# Patient Record
Sex: Female | Born: 1985 | Marital: Married
Health system: Southern US, Community
[De-identification: ages and names within clinical notes are randomized; demographics above are authoritative.]

## PROBLEM LIST (undated history)

## (undated) DIAGNOSIS — IMO0002 Reserved for concepts with insufficient information to code with codable children: Secondary | ICD-10-CM

## (undated) DIAGNOSIS — E669 Obesity, unspecified: Secondary | ICD-10-CM

## (undated) HISTORY — DX: Reserved for concepts with insufficient information to code with codable children: IMO0002

## (undated) HISTORY — DX: Obesity, unspecified: E66.9

---
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
## 2007-02-06 ENCOUNTER — Encounter: Payer: Self-pay | Admitting: Family Medicine

## 2007-05-26 ENCOUNTER — Ambulatory Visit: Payer: Self-pay | Admitting: Family Medicine

## 2007-06-05 DIAGNOSIS — E669 Obesity, unspecified: Secondary | ICD-10-CM

## 2007-06-11 ENCOUNTER — Ambulatory Visit: Payer: Self-pay | Admitting: Family Medicine

## 2007-06-20 ENCOUNTER — Encounter: Payer: Self-pay | Admitting: Family Medicine

## 2007-06-20 LAB — CONVERTED CEMR LAB
BUN: 7 mg/dL (ref 6–23)
Basophils Absolute: 0.1 10*3/uL (ref 0.0–0.1)
Basophils Relative: 1 % (ref 0–1)
CO2: 20 meq/L (ref 19–32)
Calcium: 8.5 mg/dL (ref 8.4–10.5)
Chloride: 107 meq/L (ref 96–112)
Cholesterol: 132 mg/dL (ref 0–200)
Creatinine, Ser: 0.71 mg/dL (ref 0.40–1.20)
Eosinophils Absolute: 0.2 10*3/uL (ref 0.0–0.7)
Eosinophils Relative: 2 % (ref 0–5)
Glucose, Bld: 86 mg/dL (ref 70–99)
HCT: 34.8 % — ABNORMAL LOW (ref 36.0–46.0)
HDL: 47 mg/dL (ref >39)
Hemoglobin: 10.7 g/dL — ABNORMAL LOW (ref 12.0–15.0)
LDL Cholesterol: 64 mg/dL (ref 0–99)
Lymphocytes Relative: 35 % (ref 12–46)
Lymphs Abs: 4.3 10*3/uL — ABNORMAL HIGH (ref 0.7–4.0)
MCHC: 30.7 g/dL (ref 30.0–36.0)
MCV: 67.8 fL — ABNORMAL LOW (ref 78.0–100.0)
Monocytes Absolute: 0.7 10*3/uL (ref 0.1–1.0)
Monocytes Relative: 6 % (ref 3–12)
Neutro Abs: 6.9 10*3/uL (ref 1.7–7.7)
Neutrophils Relative %: 57 % (ref 43–77)
Platelets: 429 10*3/uL — ABNORMAL HIGH (ref 150–400)
Potassium: 4 meq/L (ref 3.5–5.3)
RBC: 5.13 M/uL — ABNORMAL HIGH (ref 3.87–5.11)
RDW: 15.6 % — ABNORMAL HIGH (ref 11.5–15.5)
Sodium: 140 meq/L (ref 135–145)
Total CHOL/HDL Ratio: 2.8
Triglycerides: 106 mg/dL (ref <150)
VLDL: 21 mg/dL (ref 0–40)
WBC: 12.2 10*3/uL — ABNORMAL HIGH (ref 4.0–10.5)

## 2007-06-23 ENCOUNTER — Encounter: Payer: Self-pay | Admitting: Family Medicine

## 2007-06-23 LAB — CONVERTED CEMR LAB
Iron: 38 ug/dL — ABNORMAL LOW (ref 42–145)
Saturation Ratios: 9 % — ABNORMAL LOW (ref 20–55)
TIBC: 447 ug/dL (ref 250–470)
UIBC: 409 ug/dL

## 2007-07-17 ENCOUNTER — Ambulatory Visit: Payer: Self-pay | Admitting: Family Medicine

## 2007-08-20 ENCOUNTER — Ambulatory Visit: Payer: Self-pay | Admitting: Family Medicine

## 2007-09-01 ENCOUNTER — Telehealth: Payer: Self-pay | Admitting: Family Medicine

## 2007-12-03 ENCOUNTER — Encounter: Payer: Self-pay | Admitting: Family Medicine

## 2007-12-11 ENCOUNTER — Ambulatory Visit: Payer: Self-pay | Admitting: Family Medicine

## 2007-12-17 ENCOUNTER — Ambulatory Visit: Payer: Self-pay | Admitting: Family Medicine

## 2008-01-19 ENCOUNTER — Ambulatory Visit: Payer: Self-pay | Admitting: Family Medicine

## 2008-01-19 DIAGNOSIS — J029 Acute pharyngitis, unspecified: Secondary | ICD-10-CM

## 2008-01-19 LAB — CONVERTED CEMR LAB: Rapid Strep: NEGATIVE

## 2008-01-23 DIAGNOSIS — J309 Allergic rhinitis, unspecified: Secondary | ICD-10-CM

## 2008-04-08 ENCOUNTER — Telehealth: Payer: Self-pay | Admitting: Family Medicine

## 2008-12-14 ENCOUNTER — Encounter: Payer: Self-pay | Admitting: Family Medicine

## 2009-04-26 ENCOUNTER — Telehealth: Payer: Self-pay | Admitting: Family Medicine

## 2009-12-19 ENCOUNTER — Ambulatory Visit: Payer: Self-pay | Admitting: Family Medicine

## 2010-03-01 ENCOUNTER — Ambulatory Visit
Admission: RE | Admit: 2010-03-01 | Discharge: 2010-03-01 | Payer: Self-pay | Source: Home / Self Care | Attending: Family Medicine | Admitting: Family Medicine

## 2010-03-01 DIAGNOSIS — K5289 Other specified noninfective gastroenteritis and colitis: Secondary | ICD-10-CM | POA: Insufficient documentation

## 2010-03-07 NOTE — Assessment & Plan Note (Signed)
Summary: tb  Nurse Visit   Vital Signs:  Patient profile:   25 year old female Weight:      201.50 pounds BP sitting:   120 / 82  (left arm)  Allergies: No Known Drug Allergies  Immunizations Administered:  PPD Skin Test:    Vaccine Type: PPD    Site: right forearm    Mfr: Sanofi Pasteur    Dose: 0.1 ml    Route: ID    Given by: Adella Hare LPN    Exp. Date: 12/08/2010    Lot #: [id number]  Orders Added: 1)  TB Skin Test [86580] 2)  Admin 1st Vaccine [90471]  tb test placed with no complications  Appended Document: tb   PPD Results    Date of reading: 12/21/2009    Results: < 5mm    Interpretation: negative

## 2010-03-07 NOTE — Letter (Signed)
Summary: progress notes  progress notes   Imported By: Curtis Sites 08/24/2009 14:06:18  _____________________________________________________________________  External Attachment:    Type:   Image     Comment:   External Document

## 2010-03-07 NOTE — Letter (Signed)
Summary: phone notes  phone notes   Imported By: Curtis Sites 08/24/2009 14:02:41  _____________________________________________________________________  External Attachment:    Type:   Image     Comment:   External Document

## 2010-03-07 NOTE — Letter (Signed)
Summary: consults  consults   Imported By: Curtis Sites 08/24/2009 14:05:45  _____________________________________________________________________  External Attachment:    Type:   Image     Comment:   External Document

## 2010-03-07 NOTE — Letter (Signed)
Summary: demographic  demographic   Imported By: Curtis Sites 08/24/2009 14:08:17  _____________________________________________________________________  External Attachment:    Type:   Image     Comment:   External Document

## 2010-03-07 NOTE — Letter (Signed)
Summary: misc.  misc.   Imported By: Curtis Sites 08/24/2009 14:04:38  _____________________________________________________________________  External Attachment:    Type:   Image     Comment:   External Document

## 2010-03-07 NOTE — Progress Notes (Signed)
Summary: speak with doc  Phone Note Call from Patient   Summary of Call: omega would like to talk with dr. Lodema Hong about daughter doing some volunteer work. [phone] home [phone]  Initial call taken by: Rudene Anda,  April 26, 2009 2:17 PM  Follow-up for Phone Call        will relay message  Follow-up by: Everitt Amber LPN,  April 27, 2009 1:11 PM

## 2010-03-07 NOTE — Letter (Signed)
Summary: progress notes  progress notes   Imported By: Curtis Sites 08/24/2009 14:07:53  _____________________________________________________________________  External Attachment:    Type:   Image     Comment:   External Document

## 2010-03-23 NOTE — Assessment & Plan Note (Signed)
Summary: throwing up   Vital Signs:  Patient profile:   25 year old female Height:      66.5 inches Weight:      207.50 pounds BMI:     33.11 O2 Sat:      98 % Pulse rate:   83 / minute Pulse rhythm:   regular Resp:     16 per minute BP sitting:   122 / 90  (left arm) Cuff size:   large  Vitals Entered By: Everitt Amber LPN (March 01, 2010 4:04 PM)  Nutrition Counseling: Patient's BMI is greater than 25 and therefore counseled on weight management options. CC: Patient c/o vomiting and diarrhea since yesterday. The last time time she vomitted was an hr ago and she has diarrhea approximately every 30 minutes    CC:  Patient c/o vomiting and diarrhea since yesterday. The last time time she vomitted was an hr ago and she has diarrhea approximately every 30 minutes .  History of Present Illness: 2 day h/o vomitting and diarreah, up to 6 stools yesterday , 3 watery today, vomity x 2 today, other family members sick, no other symptoms. Reports  thatshe had been doing well prior to this. She unfortunately has not stuck wioth her diet and exercise plan and ha gained wight Denies recent fever or chills. Denies sinus pressure, nasal congestion , ear pain or sore throat. Denies chest congestion, or cough productive of sputum. Denies chest pain, palpitations, PND, orthopnea or leg swelling.  Denies change in bowel movements or bloody stool. Denies dysuria , frequency, incontinence or hesitancy. Denies  joint pain, swelling, or reduced mobility. Denies headaches, vertigo, seizures. Denies depression, anxiety or insomnia. Denies  rash, lesions, or itch.     Current Medications (verified): 1)  None  Allergies (verified): No Known Drug Allergies  Review of Systems      See HPI General:  Complains of fatigue, malaise, and weakness. Eyes:  Denies blurring and discharge. GI:  Complains of diarrhea, nausea, and vomiting. Endo:  Denies cold intolerance, excessive hunger, excessive  thirst, excessive urination, and heat intolerance. Heme:  Denies abnormal bruising and bleeding. Allergy:  Denies hives or rash and itching eyes.  Physical Exam  General:  Well-developed,well-nourished,in no acute distress; alert,appropriate and cooperative throughout examination. Ill appearing. HEENT: No facial asymmetry,  EOMI, No sinus tenderness, TM's Clear, oropharynx  pink and moist.   Chest: Clear to auscultation bilaterally.  CVS: S1, S2, No murmurs, No S3.   Abd: Soft,diffuse superficial tenderness, no guarding or rebound, hyperactive bowel sounds  MS: Adequate ROM spine, hips, shoulders and knees.  Ext: No edema.   CNS: CN 2-12 intact, power tone and sensation normal throughout.   Skin: Intact, no visible lesions or rashes.  Psych: Good eye contact, normal affect.  Memory intact, not anxious or depressed appearing.    Impression & Recommendations:  Problem # 1:  GASTROENTERITIS, ACUTE (ICD-558.9) Assessment Comment Only  Orders: Zofran 1mg . injection (G9562) Admin of Therapeutic Inj  intramuscular or subcutaneous (13086)  Discussed use of medication and role of diet. Encouraged clear liquids and electrolyte replacement fluids. Instructed to call if any signs of worsening dehydration.   Problem # 2:  OBESITY (ICD-278.00) Assessment: Deteriorated  Ht: 66.5 (03/01/2010)   Wt: 207.50 (03/01/2010)   BMI: 33.11 (03/01/2010) therapeutic lifestyle change discussed and encouraged  Complete Medication List: 1)  Lomotil 2.5-0.025 Mg Tabs (Diphenoxylate-atropine) .... One tablet up to  4 times daily as needed for diarreah 2)  Promethazine  Hcl 12.5 Mg Tabs (Promethazine hcl) .... Take 1 tablet by mouth four times a day as needed for nausea and vomitting  Patient Instructions: 1)  Please schedule a follow-up appointment in 3 months. 2)  It is important that you exercise regularly at least 20 minutes 5 times a week. If you develop chest pain, have severe difficulty breathing, or  feel very tired , stop exercising immediately and seek medical attention. 3)  You need to lose weight. Consider a lower calorie diet and regular exercise.  4)  you have acute gastroenteritis. 5)  Oral Rehydration Solution: drink 1/2 ounce every 15 minutes. If tolerated afert 1 hour, drink 1 ounce every 15 minutes. As you can tolerate, keep adding 1/2 ounce every 15 minutes, up to a total of 2-4 ounces. Contact the office if unable to tolerate oral solution, if you keep vomiting, or you continue to have signs of dehydration. 6)  You will get zofran 4mg  Im in the office. 7)  Meds are sent in  8)  Pls follow the BRAT diet. 9)  Work excuse from 01/24 to return 03/04/2010 Prescriptions: PROMETHAZINE HCL 12.5 MG TABS (PROMETHAZINE HCL) Take 1 tablet by mouth four times a day as needed for nausea and vomitting  #20 x 0   Entered and Authorized by:   Syliva Overman MD   Signed by:   Syliva Overman MD on 03/01/2010   Method used:   Printed then faxed to ...       [facility] [address] [phone]* (retail)       [address] [place], [place]  [address]       Ph: [phone]       Fax: [phone]   RxID:   [id number] LOMOTIL 2.5-0.025 MG TABS (DIPHENOXYLATE-ATROPINE) one tablet up to  4 times daily as needed for diarreah  #20 x 0   Entered and Authorized by:   Syliva Overman MD   Signed by:   Syliva Overman MD on 03/01/2010   Method used:   Printed then faxed to ...       [facility] [address] [phone]* (retail)       [address] [place]  [address]       Ph: [phone]       Fax: [phone]   RxID:   [id number]    Medication Administration  Injection # 1:    Medication: Zofran 1mg . injection    Diagnosis: GASTROENTERITIS, ACUTE (ICD-558.9)    Route: IM    Site: RUOQ gluteus    Exp Date: 04/13    Lot #: [id number]    Mfr: [email or web address]    Comments: zofran 4mg  given    Patient tolerated injection without complications    Given by: Adella Hare LPN (March 01, 2010 4:20 PM)  Orders Added: 1)  Est. Patient Level IV [15176] 2)  Zofran 1mg . injection [J2405] 3)  Admin of Therapeutic Inj  intramuscular or subcutaneous [96372]     Medication Administration  Injection # 1:    Medication: Zofran 1mg . injection    Diagnosis: GASTROENTERITIS, ACUTE (ICD-558.9)    Route: IM    Site: RUOQ gluteus    Exp Date: 04/13    Lot #: [id number]    Mfr: [email or web address]    Comments: zofran 4mg  given    Patient tolerated injection without complications    Given by: Adella Hare LPN (March 01, 2010 4:20 PM)  Orders Added: 1)  Est. Patient Level IV [54098] 2)  Zofran 1mg . injection [J2405] 3)  Admin of Therapeutic Inj  intramuscular or subcutaneous [11914]

## 2010-05-25 ENCOUNTER — Encounter: Payer: Self-pay | Admitting: Family Medicine

## 2010-05-29 ENCOUNTER — Ambulatory Visit: Payer: Self-pay | Admitting: Family Medicine

## 2011-01-10 ENCOUNTER — Ambulatory Visit (INDEPENDENT_AMBULATORY_CARE_PROVIDER_SITE_OTHER): Payer: Self-pay

## 2011-01-10 DIAGNOSIS — Z23 Encounter for immunization: Secondary | ICD-10-CM

## 2011-02-08 ENCOUNTER — Ambulatory Visit (INDEPENDENT_AMBULATORY_CARE_PROVIDER_SITE_OTHER): Payer: Managed Care, Other (non HMO) | Admitting: Family Medicine

## 2011-02-08 ENCOUNTER — Encounter: Payer: Self-pay | Admitting: Family Medicine

## 2011-02-08 ENCOUNTER — Other Ambulatory Visit: Payer: Self-pay | Admitting: Family Medicine

## 2011-02-08 VITALS — BP 112/74 | HR 94 | Resp 16 | Ht 66.0 in | Wt 251.8 lb

## 2011-02-08 DIAGNOSIS — Z309 Encounter for contraceptive management, unspecified: Secondary | ICD-10-CM

## 2011-02-08 DIAGNOSIS — E559 Vitamin D deficiency, unspecified: Secondary | ICD-10-CM

## 2011-02-08 DIAGNOSIS — Z0189 Encounter for other specified special examinations: Secondary | ICD-10-CM

## 2011-02-08 DIAGNOSIS — Z23 Encounter for immunization: Secondary | ICD-10-CM

## 2011-02-08 NOTE — Patient Instructions (Signed)
F/u in 4 months.  It is important that you exercise regularly at least 45 minutes 5 times a week. If you develop chest pain, have severe difficulty breathing, or feel very tired, stop exercising immediately and seek medical attention   A healthy diet is rich in fruit, vegetables and whole grains. Poultry fish, nuts and beans are a healthy choice for protein rather then red meat. A low sodium diet and drinking 64 ounces of water daily is generally recommended. Oils and sweet should be limited. Carbohydrates especially for those who are diabetic or overweight, should be limited to 34-45 gram per meal. It is important to eat on a regular schedule, at least 3 times daily. Snacks should be primarily fruits, vegetables or nuts.    Fasting labs asap  TdaP today.  You will receive a 1500 cal diet sheet  Continue your current contraception, as we discussed

## 2011-02-09 LAB — HEMOGLOBIN A1C: Mean Plasma Glucose: 114 mg/dL (ref <117)

## 2011-02-09 LAB — DIFFERENTIAL
Basophils Absolute: 0.1 10*3/uL (ref 0.0–0.1)
Basophils Relative: 1 % (ref 0–1)
Eosinophils Absolute: 0.2 10*3/uL (ref 0.0–0.7)
Neutrophils Relative %: 68 % (ref 43–77)

## 2011-02-09 LAB — COMPREHENSIVE METABOLIC PANEL
Albumin: 4.1 g/dL (ref 3.5–5.2)
Alkaline Phosphatase: 106 U/L (ref 39–117)
BUN: 11 mg/dL (ref 6–23)
Calcium: 9.5 mg/dL (ref 8.4–10.5)
Chloride: 104 mEq/L (ref 96–112)
Glucose, Bld: 89 mg/dL (ref 70–99)
Potassium: 4.4 mEq/L (ref 3.5–5.3)

## 2011-02-09 LAB — CBC
HCT: 36.5 % (ref 36.0–46.0)
Hemoglobin: 11.2 g/dL — ABNORMAL LOW (ref 12.0–15.0)
MCH: 20.7 pg — ABNORMAL LOW (ref 26.0–34.0)
MCHC: 30.7 g/dL (ref 30.0–36.0)
MCV: 67.3 fL — ABNORMAL LOW (ref 78.0–100.0)
Platelets: 490 10*3/uL — ABNORMAL HIGH (ref 150–400)
RBC: 5.42 MIL/uL — ABNORMAL HIGH (ref 3.87–5.11)
RDW: 14.9 % (ref 11.5–15.5)
WBC: 11.6 10*3/uL — ABNORMAL HIGH (ref 4.0–10.5)

## 2011-02-09 LAB — LIPID PANEL
Cholesterol: 134 mg/dL (ref 0–200)
HDL: 47 mg/dL (ref >39)
LDL Cholesterol: 73 mg/dL (ref 0–99)
Total CHOL/HDL Ratio: 2.9 Ratio
Triglycerides: 68 mg/dL (ref <150)
VLDL: 14 mg/dL (ref 0–40)

## 2011-02-09 LAB — VITAMIN D 25 HYDROXY (VIT D DEFICIENCY, FRACTURES): Vit D, 25-Hydroxy: 22 ng/mL — ABNORMAL LOW (ref 30–89)

## 2011-02-09 LAB — TSH: TSH: 2.616 u[IU]/mL (ref 0.350–4.500)

## 2011-02-11 DIAGNOSIS — E559 Vitamin D deficiency, unspecified: Secondary | ICD-10-CM | POA: Insufficient documentation

## 2011-02-11 DIAGNOSIS — Z309 Encounter for contraceptive management, unspecified: Secondary | ICD-10-CM | POA: Insufficient documentation

## 2011-02-11 NOTE — Assessment & Plan Note (Signed)
counselled re lifestyle change and commitment to this for health needs. Goal is 4 pounds per month of weight loss.No dietary supplements at this time. Will screen for prediabetes/diabetes and lipids as well as thyroid disease

## 2011-02-11 NOTE — Assessment & Plan Note (Signed)
Pt to take OTC vit D once daily 800Iu of D3, she will be called with the info

## 2011-02-11 NOTE — Assessment & Plan Note (Signed)
Discussed pros and cons of contraceptive options. Since obesity is a concern, advised sytaying on OCP at this time

## 2011-02-11 NOTE — Progress Notes (Signed)
Subjective:     Patient ID: Laurie Chen, female   DOB: 1985/11/30, 26 y.o.   MRN: [id number]  HPI Pt is here to re establish care, and she has concerns about contraception as well as obesity. She has not been involved in any serious lifestyle change for improved health and weight loss, but started an exercise program one week ago. States she is motivated to changing her det at this time also. Has questions about progesterone only , long acting hormonal contraception , no mention of "forgetting the pill"I advised this was more associated with weight gain, and encouraged her to continue with what she was taking. Had pap 1 month ago reportedly, and has completed  The HPV series   Review of Systems See HPI Denies recent fever or chills. Denies sinus pressure, nasal congestion, ear pain or sore throat. Denies chest congestion, productive cough or wheezing. Denies chest pains, palpitations and leg swelling Denies abdominal pain, nausea, vomiting,diarrhea or constipation.   Denies dysuria, frequency, hesitancy or incontinence. Denies joint pain, swelling and limitation in mobility. Denies headaches, seizures, numbness, or tingling. Denies depression, anxiety or insomnia. Denies skin break down or rash.        Objective:   Physical Exam Patient alert and oriented and in no cardiopulmonary distress.  HEENT: No facial asymmetry, EOMI, no sinus tenderness,  oropharynx pink and moist.  Neck supple no adenopathy.  Chest: Clear to auscultation bilaterally.  CVS: S1, S2 no murmurs, no S3.  ABD: Soft non tender. Bowel sounds normal.  Ext: No edema  MS: Adequate ROM spine, shoulders, hips and knees.  Skin: Intact, no ulcerations or rash noted.  Psych: Good eye contact, normal affect. Memory intact not anxious or depressed appearing.  CNS: CN 2-12 intact, power, tone and sensation normal throughout.     Assessment:         Plan:

## 2011-02-20 ENCOUNTER — Encounter: Payer: Self-pay | Admitting: Family Medicine

## 2011-02-21 ENCOUNTER — Encounter: Payer: Self-pay | Admitting: Family Medicine

## 2011-02-21 ENCOUNTER — Ambulatory Visit (INDEPENDENT_AMBULATORY_CARE_PROVIDER_SITE_OTHER): Payer: Managed Care, Other (non HMO) | Admitting: Family Medicine

## 2011-02-21 VITALS — BP 118/74 | HR 100 | Temp 98.9°F | Resp 18 | Ht 66.0 in | Wt 246.1 lb

## 2011-02-21 DIAGNOSIS — E669 Obesity, unspecified: Secondary | ICD-10-CM

## 2011-02-21 DIAGNOSIS — J029 Acute pharyngitis, unspecified: Secondary | ICD-10-CM

## 2011-02-21 DIAGNOSIS — E559 Vitamin D deficiency, unspecified: Secondary | ICD-10-CM

## 2011-02-21 MED ORDER — FLUCONAZOLE 150 MG PO TABS: ORAL_TABLET | ORAL | Status: AC

## 2011-02-21 MED ORDER — PENICILLIN V POTASSIUM 500 MG PO TABS: 500.0000 mg | ORAL_TABLET | Freq: Three times a day (TID) | ORAL | Status: AC

## 2011-02-21 NOTE — Assessment & Plan Note (Signed)
Improved. Pt applauded on succesful weight loss through lifestyle change, and encouraged to continue same. Weight loss goal set for the next several months.  

## 2011-02-21 NOTE — Progress Notes (Signed)
  Subjective:    Patient ID: Laurie Chen, female    DOB: 1986-01-09, 26 y.o.   MRN: [id number]  HPI 2 day h/o sore throat , chills , loss of voice and tender neck glands, Did not work yesterday due to symptoms, still no better. She denies chest congestion or productive cough. Pt reports prior to this she haas been well, she has been working on lifestyle change for weight loss with success since her last visit    Review of Systems See HPI  Denies chest pains, palpitations and leg swelling Denies abdominal pain, nausea, vomiting,diarrhea or constipation.   Denies dysuria, frequency, hesitancy or incontinence. Denies headaches, seizures, numbness, or tingling.       Objective:   Physical Exam Patient alert and oriented and in no cardiopulmonary distress.  HEENT: No facial asymmetry, EOMI, no sinus tenderness,  oropharynx erythematous and moist.  Neck supple bilateral anterior adenitis  Chest: Clear to auscultation bilaterally.  CVS: S1, S2 no murmurs, no S3.  ABD: Soft non tender. Bowel sounds normal.  Ext: No edema  MS: adequate ROM spine, shoulders, hips and knees      Assessment & Plan:

## 2011-02-21 NOTE — Patient Instructions (Signed)
F/u as before.  You are being treated for acute pharyngitis.  Please take all medication as prescribed.  Congrats on weight loss  You will receive a work excuse.

## 2011-02-22 ENCOUNTER — Telehealth: Payer: Self-pay | Admitting: Family Medicine

## 2011-02-22 NOTE — Telephone Encounter (Signed)
Ok per Dr Lodema Hong, already collected

## 2011-02-22 NOTE — Telephone Encounter (Signed)
Can we extend?

## 2011-02-24 NOTE — Assessment & Plan Note (Signed)
Pt to use daily OTC supplements

## 2011-02-24 NOTE — Assessment & Plan Note (Signed)
Acute symptoms with voice loss, antibiotics prescribed and work excuse

## 2011-02-24 NOTE — Assessment & Plan Note (Signed)
Improved. Pt applauded on succesful weight loss through lifestyle change, and encouraged to continue same. Weight loss goal set for the next several months.  

## 2011-05-09 ENCOUNTER — Encounter: Payer: Self-pay | Admitting: Family Medicine

## 2011-05-09 ENCOUNTER — Ambulatory Visit (INDEPENDENT_AMBULATORY_CARE_PROVIDER_SITE_OTHER): Payer: Managed Care, Other (non HMO) | Admitting: Family Medicine

## 2011-05-09 VITALS — BP 120/86 | HR 82 | Temp 99.3°F | Resp 16 | Ht 66.0 in | Wt 249.1 lb

## 2011-05-09 DIAGNOSIS — J029 Acute pharyngitis, unspecified: Secondary | ICD-10-CM

## 2011-05-09 DIAGNOSIS — E669 Obesity, unspecified: Secondary | ICD-10-CM

## 2011-05-09 DIAGNOSIS — J019 Acute sinusitis, unspecified: Secondary | ICD-10-CM

## 2011-05-09 LAB — POCT RAPID STREP A (OFFICE): Rapid Strep A Screen: NEGATIVE

## 2011-05-09 MED ORDER — FLUCONAZOLE 150 MG PO TABS: ORAL_TABLET | ORAL | Status: AC

## 2011-05-09 MED ORDER — PENICILLIN V POTASSIUM 500 MG PO TABS: 500.0000 mg | ORAL_TABLET | Freq: Three times a day (TID) | ORAL | Status: AC

## 2011-05-09 NOTE — Assessment & Plan Note (Signed)
Unchanged. Patient re-educated about  the importance of commitment to a  minimum of 150 minutes of exercise per week. The importance of healthy food choices with portion control discussed. Encouraged to start a food diary, count calories and to consider  joining a support group. Sample diet sheets offered. Goals set by the patient for the next several months.    

## 2011-05-09 NOTE — Progress Notes (Signed)
  Subjective:    Patient ID: Laurie Chen, female    DOB: 1985/05/12, 26 y.o.   MRN: [id number]  HPI r4 day h/o head congestion and pressure with yellow green nasal drainage, has also had sore throat, denies fever , has had  Chills, no fever.c/o sore throat. Has worked on weight loss, with no success, which seems surprising to her  Review of Systems See HPI Denies recent fever or chills.  Denies chest congestion, productive cougDh or wheezing. Denies chest pains, palpitations and leg swelling Denies abdominal pain, nausea, vomiting,diarrhea or constipation.   Denies dysuria, frequency, hesitancy or incontinence. Denies joint pain, swelling and limitation in mobility. Denies headaches, seizures, numbness, or tingling. Denies depression, anxiety or insomnia. Denies skin break down or rash.        Objective:   Physical Exam Patient alert and oriented and in no cardiopulmonary distress.  HEENT: No facial asymmetry, EOMI,maxillary sinus tenderness,  oropharynx erythematous and moist.  Neck supple no adenopathy.  Chest: Clear to auscultation bilaterally.  CVS: S1, S2 no murmurs, no S3.  ABD: Soft non tender. Bowel sounds normal.  Ext: No edema  MS: Adequate ROM spine, shoulders, hips and knees.  Skin: Intact, no ulcerations or rash noted.  Psych: Good eye contact, normal affect. Memory intact not anxious or depressed appearing.  CNS: CN 2-12 intact, power, tone and sensation normal throughout.        Assessment & Plan:

## 2011-05-09 NOTE — Patient Instructions (Signed)
F/u in 6 month or sooner if needed  You are being treated for pharyngitis and sinusitis.  Work excuse for today only  It is important that you exercise regularly at least 30 minutes 5 times a week. If you develop chest pain, have severe difficulty breathing, or feel very tired, stop exercising immediately and seek medical attention   A healthy diet is rich in fruit, vegetables and whole grains. Poultry fish, nuts and beans are a healthy choice for protein rather then red meat. A low sodium diet and drinking 64 ounces of water daily is generally recommended. Oils and sweet should be limited. Carbohydrates especially for those who are diabetic or overweight, should be limited to 34-45 gram per meal. It is important to eat on a regular schedule, at least 3 times daily. Snacks should be primarily fruits, vegetables or nuts.   Remember the birth control pills are less effective while you are on penicillin, use condoms also for the next 2 months

## 2011-05-13 DIAGNOSIS — J019 Acute sinusitis, unspecified: Secondary | ICD-10-CM | POA: Insufficient documentation

## 2011-05-13 NOTE — Assessment & Plan Note (Signed)
Unchanged. Patient re-educated about  the importance of commitment to a  minimum of 150 minutes of exercise per week. The importance of healthy food choices with portion control discussed. Encouraged to start a food diary, count calories and to consider  joining a support group. Sample diet sheets offered. Goals set by the patient for the next several months.    

## 2011-05-13 NOTE — Assessment & Plan Note (Signed)
Acute symptoms, rapid strep negative

## 2011-05-13 NOTE — Assessment & Plan Note (Signed)
Antibiotic course prescribed 

## 2011-06-13 ENCOUNTER — Ambulatory Visit: Payer: Self-pay | Admitting: Family Medicine

## 2011-08-23 ENCOUNTER — Ambulatory Visit: Payer: Managed Care, Other (non HMO) | Admitting: Family Medicine

## 2011-08-23 ENCOUNTER — Telehealth: Payer: Self-pay | Admitting: Family Medicine

## 2011-08-23 NOTE — Telephone Encounter (Signed)
Coming in today

## 2011-08-23 NOTE — Telephone Encounter (Signed)
Has not been treated here for this in the past needs ov, pls work in with any provider who has openening

## 2011-08-23 NOTE — Telephone Encounter (Signed)
appt cancelled reportedly

## 2011-12-13 ENCOUNTER — Telehealth: Payer: Self-pay | Admitting: Family Medicine

## 2011-12-13 NOTE — Telephone Encounter (Signed)
Called patient and left message for them to return call at the office   

## 2011-12-13 NOTE — Telephone Encounter (Signed)
Can a diflucan be sent in?

## 2011-12-13 NOTE — Telephone Encounter (Signed)
What medication did she get , and what are her current symptoms and for how long, pls document

## 2011-12-14 NOTE — Telephone Encounter (Signed)
Called patient and left message for them to return call at the office   

## 2012-04-03 ENCOUNTER — Encounter: Payer: Self-pay | Admitting: Family Medicine

## 2012-04-03 ENCOUNTER — Ambulatory Visit (INDEPENDENT_AMBULATORY_CARE_PROVIDER_SITE_OTHER): Payer: Managed Care, Other (non HMO) | Admitting: Family Medicine

## 2012-04-03 DIAGNOSIS — K5900 Constipation, unspecified: Secondary | ICD-10-CM

## 2012-04-03 MED ORDER — PHENTERMINE HCL 37.5 MG PO TABS: 37.5000 mg | ORAL_TABLET | Freq: Every day | ORAL | Status: DC

## 2012-04-03 NOTE — Progress Notes (Signed)
  Subjective:    Patient ID: Laurie Chen, female    DOB: 1985/10/20, 27 y.o.   MRN: [id number]  HPI Patient here secondary to obesity she's currently at her heaviest adult weight. At age of 30 she was 180 pounds she started gaining weight when she went to college. She's tried multiple diets including cutting out all carbs and eating only take foods and grilled foods. She also exercises 3045 minutes 3 times a week. 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 Her 24-hour recall no breakfast, lunch salad with grilled chicken and low-fat dressing, dinner big chicken with green beans she does admit to eating a lot of chips injury juice if she does not like water she often will have constipation secondary to this.  She wants to go on a diet pill, has not used any OTC supplements, she has gained 18lbs since last visit with PCP in April    Review of Systems  GEN- denies fatigue, fever, weight loss,weakness, recent illness HEENT- denies eye drainage, change in vision, nasal discharge, CVS- denies chest pain, palpitations RESP- denies SOB, cough, wheeze ABD- denies N/V, change in stools, abd pain GU- denies dysuria, hematuria, dribbling, incontinence MSK- denies joint pain, muscle aches, injury Neuro- denies headache, dizziness, syncope, seizure activity      Objective:   Physical Exam GEN- NAD, alert and oriented x3, obese HEENT- PERRL, EOMI, non injected sclera, pink conjunctiva, MMM, oropharynx clear Neck- Supple, no thyromegaly CVS- RRR, no murmur RESP-CTAB ABD-NABS,soft,NT,ND EXT- No edema Pulses- Radial, DP- 2+ Psych-normal affect and mood       Assessment & Plan:

## 2012-04-03 NOTE — Patient Instructions (Signed)
Start the phentermine take 1/2 tablet a day  You can increase to 1 tablet daily after 2 weeks Goal- 8lbs off  Read the handout  F/U 8 weeks

## 2012-04-04 DIAGNOSIS — K5900 Constipation, unspecified: Secondary | ICD-10-CM | POA: Insufficient documentation

## 2012-04-04 NOTE — Assessment & Plan Note (Signed)
I think this is dietary related. I discussed importance of water and fiber in her diet. She can also tried MiraLAX over-the-counter if needed

## 2012-04-04 NOTE — Assessment & Plan Note (Signed)
Morbidly obese. She is 6 area high-risk for coronary artery disease diabetes mellitus hypertension. Because of her family members are also obese. We spent approximately 20 minutes on the visit with greater than 50% spent on counseling on proper diet and activity. She was given handout on foods I will go ahead and start her on phentermine discussed the risks and benefits of taking the medication as well as side effects

## 2012-05-29 ENCOUNTER — Ambulatory Visit: Payer: Managed Care, Other (non HMO) | Admitting: Family Medicine

## 2012-06-02 ENCOUNTER — Encounter: Payer: Self-pay | Admitting: Family Medicine

## 2012-06-02 ENCOUNTER — Ambulatory Visit (INDEPENDENT_AMBULATORY_CARE_PROVIDER_SITE_OTHER): Payer: Managed Care, Other (non HMO) | Admitting: Family Medicine

## 2012-06-02 VITALS — BP 120/70 | HR 74 | Resp 16 | Ht 66.0 in | Wt 254.1 lb

## 2012-06-02 DIAGNOSIS — D649 Anemia, unspecified: Secondary | ICD-10-CM

## 2012-06-02 DIAGNOSIS — K5900 Constipation, unspecified: Secondary | ICD-10-CM

## 2012-06-02 MED ORDER — PHENTERMINE HCL 37.5 MG PO TABS: 37.5000 mg | ORAL_TABLET | Freq: Every day | ORAL | Status: DC

## 2012-06-02 NOTE — Assessment & Plan Note (Signed)
Due to iron def in past, recheck for this year, last 11.2

## 2012-06-02 NOTE — Assessment & Plan Note (Signed)
Discussed fiber, water again. Miralax, she did not see this notation on last visit, 1 cap full daily

## 2012-06-02 NOTE — Assessment & Plan Note (Signed)
Improved with phentermine, 13lb weight loss 8 weeks, continue phentermine for another 8 weeks, increase aerobic exercise/cardio RTC with PCP for recheck FLP normal  1 year ago

## 2012-06-02 NOTE — Patient Instructions (Signed)
Continue the phenterimine Increase your aerobic exercise to 30- 60 minutes of cardio 4 days a week Miralax - Purple and pink bottle F/U Dr, Lodema Hong in 2 months

## 2012-06-02 NOTE — Progress Notes (Signed)
  Subjective:    Patient ID: Laurie Chen, female    DOB: [birth year], 27 y.o.   MRN: [id number]  HPI  Patient here to followup weight loss. She was started on phentermine 8 weeks ago she has lost 13 pounds. She is increasing her activity was doing more around the house. She's also change her diet 24-hour recall she has Cheerios for breakfast with fruit lunch she has salad with grilled chicken she has raisins or fruit for snacking she eats salmon cakes or chicken and veggies for dinner she'll also have another snack with typically apples before bedtime. Her beverages consists of water and green tea Continues to have hard stools and straining with stools, improved some with increase water, BM every other day  Review of Systems  GEN- denies fatigue, fever, weight loss,weakness, recent illness HEENT- denies eye drainage, change in vision, nasal discharge, CVS- denies chest pain, palpitations RESP- denies SOB, cough, wheeze Neuro- denies headache, dizziness, syncope, seizure activity      Objective:   Physical Exam  GEN- NAD, alert and oriented x3 CVS- RRR, no murmur RESP-CTAB EXT- No edema Pulses- Radial 2+        Assessment & Plan:

## 2012-06-05 ENCOUNTER — Ambulatory Visit: Payer: Managed Care, Other (non HMO) | Admitting: Family Medicine

## 2012-08-28 ENCOUNTER — Encounter: Payer: Self-pay | Admitting: Family Medicine

## 2012-08-28 ENCOUNTER — Ambulatory Visit: Payer: Managed Care, Other (non HMO) | Admitting: Family Medicine

## 2013-03-23 ENCOUNTER — Encounter (INDEPENDENT_AMBULATORY_CARE_PROVIDER_SITE_OTHER): Payer: Self-pay

## 2013-03-23 ENCOUNTER — Ambulatory Visit (INDEPENDENT_AMBULATORY_CARE_PROVIDER_SITE_OTHER): Payer: Managed Care, Other (non HMO) | Admitting: Family Medicine

## 2013-03-23 ENCOUNTER — Encounter: Payer: Self-pay | Admitting: Family Medicine

## 2013-03-23 VITALS — BP 106/70 | HR 70 | Resp 18 | Ht 66.0 in | Wt 254.1 lb

## 2013-03-23 DIAGNOSIS — L708 Other acne: Secondary | ICD-10-CM

## 2013-03-23 DIAGNOSIS — L700 Acne vulgaris: Secondary | ICD-10-CM | POA: Insufficient documentation

## 2013-03-23 DIAGNOSIS — E559 Vitamin D deficiency, unspecified: Secondary | ICD-10-CM

## 2013-03-23 DIAGNOSIS — R946 Abnormal results of thyroid function studies: Secondary | ICD-10-CM

## 2013-03-23 DIAGNOSIS — L259 Unspecified contact dermatitis, unspecified cause: Secondary | ICD-10-CM

## 2013-03-23 DIAGNOSIS — D649 Anemia, unspecified: Secondary | ICD-10-CM

## 2013-03-23 DIAGNOSIS — L409 Psoriasis, unspecified: Secondary | ICD-10-CM

## 2013-03-23 DIAGNOSIS — L408 Other psoriasis: Secondary | ICD-10-CM

## 2013-03-23 DIAGNOSIS — J309 Allergic rhinitis, unspecified: Secondary | ICD-10-CM

## 2013-03-23 DIAGNOSIS — Z13220 Encounter for screening for lipoid disorders: Secondary | ICD-10-CM

## 2013-03-23 DIAGNOSIS — L309 Dermatitis, unspecified: Secondary | ICD-10-CM | POA: Insufficient documentation

## 2013-03-23 NOTE — Assessment & Plan Note (Signed)
Uses otc meds as needed

## 2013-03-23 NOTE — Patient Instructions (Signed)
F/U in 3.5 months, call if you need me before  You are referred for an KoreauS of your thyroid gland followed by an appointment with Dr Fransico HimNida  It is important that you exercise regularly at least 30 minutes 7 times a week. If you develop chest pain, have severe difficulty breathing, or feel very tired, stop exercising immediately and seek medical attention  1200 to 1500 calories per day  Weight loss goal of 2.5 to 3 pounds per month    A healthy diet is rich in fruit, vegetables and whole grains. Poultry fish, nuts and beans are a healthy choice for protein rather then red meat. A low sodium diet and drinking 64 ounces of water daily is generally recommended. Oils and sweet should be limited. Carbohydrates especially for those who are diabetic or overweight, should be limited to 45 to 60 gram per meal. It is important to eat on a regular schedule, at least 3 times daily. Snacks should be primarily fruits, vegetables or nuts.  Adequate rest, generally 6 to 8 hours per night is important for good health.Good sleep hygiene involves setting a regular bedtime, and turning off all sound and light in your sleep environment.Limiting caffeine intake will also help with the ability to rest well  Lab work needs to be done 3 to 5 days this week if possible.CBC, fasting lipid, chem 7, HBA1C, Vit D  All medications need to be brought to every visit

## 2013-03-23 NOTE — Assessment & Plan Note (Signed)
Updated lab needed as soon as possible.  

## 2013-03-23 NOTE — Progress Notes (Signed)
   Subjective:    Patient ID: Laurie Chen, female    DOB: 01-30-1986, 28 y.o.   MRN: [id number]  HPI  Pt sent by dermatologist due to recent abnormality in thyroid labs. She notes hair loss in the past month, this is the only symptom she notes. Diagnosed with psoriasis last Fall, and on med, for this.   has light /no periods with implanon Had excessive approx 50 pound weight gain between 2012 to 2013, unclear reason and has been unable to lose weight since then.   Review of Systems See HPI Denies recent fever or chills. Denies sinus pressure, nasal congestion, ear pain or sore throat. Denies chest congestion, productive cough or wheezing. Denies chest pains, palpitations and leg swelling Denies abdominal pain, nausea, vomiting,diarrhea or constipation.   Denies dysuria, frequency, hesitancy or incontinence. Denies joint pain, swelling and limitation in mobility. Denies headaches, seizures, numbness, or tingling. Denies depression, anxiety or insomnia.        Objective:   Physical Exam  BP 106/70  Pulse 70  Resp 18  Ht 5\' 6"  (1.676 m)  Wt 254 lb 1.9 oz (115.268 kg)  BMI 41.04 kg/m2  SpO2 99% Patient alert and oriented and in no cardiopulmonary distress.  HEENT: No facial asymmetry, EOMI, no sinus tenderness,  oropharynx pink and moist.  Neck supple no adenopathy.  Chest: Clear to auscultation bilaterally.  CVS: S1, S2 no murmurs, no S3.  ABD: Soft non tender. Bowel sounds normal.  Ext: No edema  MS: Adequate ROM spine, shoulders, hips and knees.  Skin: Intact, mild facial acne, hyperpigmented macular lesion on left cheek, her birthmark. No scaling seen in exposed areas from psoriasis  Psych: Good eye contact, normal affect. Memory intact not anxious or depressed appearing.  CNS: CN 2-12 intact, power, tone and sensation normal throughout.       Assessment & Plan:  Thyroid function test abnormal Recent abnormal thyroid function tests, inability to lose  weight with 50 pound weight loss in less than 1 year, several years ago and goiter, refer for US and endo eval  Morbid obesity Unchanged, no real benefit with phentermine in the past, will seek full endo eval before re approaching use of appetite suppressant Patient re-educated about  the importance of commitment to a  minimum of 150 minutes of exercise per week. The importance of healthy food choices with portion control discussed. Encouraged to start a food diary, count calories and to consider  joining a support group. Sample diet sheets offered. Goals set by the patient for the next several months.     Vitamin D deficiency Updated lab needed as soon as possible.   Psoriasis Managed by derm, and topical steroid used  Acne vulgaris Daily minocycline  prescribed  ALLERGIC RHINITIS CAUSE UNSPECIFIED Uses otc meds as needed  Anemia Updated lab needed as soon as possible

## 2013-03-23 NOTE — Assessment & Plan Note (Signed)
Recent abnormal thyroid function tests, inability to lose weight with 50 pound weight loss in less than 1 year, several years ago and goiter, refer for US and endo eval

## 2013-03-23 NOTE — Assessment & Plan Note (Signed)
Managed by derm, and topical steroid used

## 2013-03-23 NOTE — Assessment & Plan Note (Signed)
Daily minocycline  prescribed

## 2013-03-23 NOTE — Assessment & Plan Note (Signed)
Updated lab needed as soon as possible.

## 2013-03-23 NOTE — Assessment & Plan Note (Signed)
Unchanged, no real benefit with phentermine in the past, will seek full endo eval before re approaching use of appetite suppressant Patient re-educated about  the importance of commitment to a  minimum of 150 minutes of exercise per week. The importance of healthy food choices with portion control discussed. Encouraged to start a food diary, count calories and to consider  joining a support group. Sample diet sheets offered. Goals set by the patient for the next several months.

## 2013-03-26 ENCOUNTER — Ambulatory Visit (HOSPITAL_COMMUNITY): Payer: Managed Care, Other (non HMO)

## 2013-03-27 ENCOUNTER — Other Ambulatory Visit: Payer: Self-pay | Admitting: Family Medicine

## 2013-03-27 ENCOUNTER — Ambulatory Visit (HOSPITAL_COMMUNITY)
Admission: RE | Admit: 2013-03-27 | Discharge: 2013-03-27 | Disposition: A | Discharge status: Home / Self Care | Payer: Managed Care, Other (non HMO) | Source: Ambulatory Visit | Attending: Family Medicine | Admitting: Family Medicine

## 2013-03-27 DIAGNOSIS — R946 Abnormal results of thyroid function studies: Secondary | ICD-10-CM | POA: Insufficient documentation

## 2013-03-27 LAB — CBC
HEMATOCRIT: 34.9 % — AB (ref 36.0–46.0)
Hemoglobin: 11 g/dL — ABNORMAL LOW (ref 12.0–15.0)
MCH: 21 pg — ABNORMAL LOW (ref 26.0–34.0)
MCHC: 31.5 g/dL (ref 30.0–36.0)
MCV: 66.7 fL — AB (ref 78.0–100.0)
PLATELETS: 397 10*3/uL (ref 150–400)
RBC: 5.23 MIL/uL — AB (ref 3.87–5.11)
RDW: 16.4 % — ABNORMAL HIGH (ref 11.5–15.5)
WBC: 9.4 10*3/uL (ref 4.0–10.5)

## 2013-03-27 LAB — BASIC METABOLIC PANEL
BUN: 11 mg/dL (ref 6–23)
CHLORIDE: 105 meq/L (ref 96–112)
CO2: 26 mEq/L (ref 19–32)
Calcium: 9.3 mg/dL (ref 8.4–10.5)
Creat: 0.75 mg/dL (ref 0.50–1.10)
Glucose, Bld: 87 mg/dL (ref 70–99)
POTASSIUM: 4.3 meq/L (ref 3.5–5.3)
SODIUM: 138 meq/L (ref 135–145)

## 2013-03-27 LAB — LIPID PANEL
CHOL/HDL RATIO: 2.7 ratio
Cholesterol: 112 mg/dL (ref 0–200)
HDL: 41 mg/dL (ref >39)
LDL CALC: 56 mg/dL (ref 0–99)
Triglycerides: 74 mg/dL (ref <150)
VLDL: 15 mg/dL (ref 0–40)

## 2013-03-27 LAB — HEMOGLOBIN A1C
HEMOGLOBIN A1C: 5.5 % (ref <5.7)
MEAN PLASMA GLUCOSE: 111 mg/dL (ref <117)

## 2013-03-28 LAB — VITAMIN D 25 HYDROXY (VIT D DEFICIENCY, FRACTURES): Vit D, 25-Hydroxy: 23 ng/mL — ABNORMAL LOW (ref 30–89)

## 2013-03-30 LAB — FERRITIN: FERRITIN: 28 ng/mL (ref 10–291)

## 2013-03-30 LAB — IRON: Iron: 48 ug/dL (ref 42–145)

## 2013-04-09 ENCOUNTER — Other Ambulatory Visit (HOSPITAL_COMMUNITY): Payer: Self-pay | Admitting: Endocrinology

## 2013-04-09 DIAGNOSIS — E041 Nontoxic single thyroid nodule: Secondary | ICD-10-CM

## 2013-07-30 ENCOUNTER — Telehealth: Payer: Self-pay

## 2013-07-30 DIAGNOSIS — M25561 Pain in right knee: Secondary | ICD-10-CM

## 2013-07-30 NOTE — Telephone Encounter (Signed)
OK xray of right knee . If she has h/o direct trauma to knee, like a fall needs to see ortho, ok to refer , I will sign. If no trauma I also suggest ibuprofen 800 mg one 3 times daily for 5 days only  Ok to send this in.  I entered the order for the xray, pls send in the ibuprofen after you speak with the pt, also ok  To send in pred 5mg  one twice daily #10 also as anti inflammatory pls let me know response  If she still is symptomatic next Monday, (if she takes oral meds) needs to call for ortho referral, I will sign

## 2013-07-30 NOTE — Telephone Encounter (Signed)
States her right knee has been swollen x 2 days and it hurts to walk on it. Also it pops and cracks when she puts weight on it. Wants an xray ordered. Please advise

## 2013-07-31 ENCOUNTER — Ambulatory Visit (HOSPITAL_COMMUNITY)
Admission: RE | Admit: 2013-07-31 | Discharge: 2013-07-31 | Disposition: A | Discharge status: Home / Self Care | Payer: BC Managed Care – PPO | Source: Ambulatory Visit | Attending: Family Medicine | Admitting: Family Medicine

## 2013-07-31 ENCOUNTER — Other Ambulatory Visit: Payer: Self-pay

## 2013-07-31 DIAGNOSIS — M25561 Pain in right knee: Secondary | ICD-10-CM

## 2013-07-31 DIAGNOSIS — M25469 Effusion, unspecified knee: Secondary | ICD-10-CM | POA: Insufficient documentation

## 2013-07-31 DIAGNOSIS — M25569 Pain in unspecified knee: Secondary | ICD-10-CM | POA: Insufficient documentation

## 2013-07-31 MED ORDER — PREDNISONE 5 MG PO TABS: 5.0000 mg | ORAL_TABLET | Freq: Two times a day (BID) | ORAL | Status: DC

## 2013-07-31 MED ORDER — IBUPROFEN 800 MG PO TABS: 800.0000 mg | ORAL_TABLET | Freq: Three times a day (TID) | ORAL | Status: DC

## 2013-07-31 NOTE — Telephone Encounter (Signed)
Mother aware and will tell daughter. meds sent

## 2013-08-03 ENCOUNTER — Telehealth: Payer: Self-pay | Admitting: Family Medicine

## 2013-08-04 ENCOUNTER — Telehealth: Payer: Self-pay | Admitting: Family Medicine

## 2013-08-04 NOTE — Telephone Encounter (Signed)
Patient aware to go to urgent care and call back if decides on ortho referral

## 2013-08-04 NOTE — Telephone Encounter (Signed)
pls explain to -pt that i am unable to provider this letter, I have nefer evaluated her clinically for knee pain, i got a tele msg last week, and advised that she see ortho re the knee if no better. i recommend she be evaluated clinicallty, either at urgent care ot at ortho ooffice today to get this letter, there is no availability today for work in and I unfortunately am unable to provide the letter requested Since I am getting the msg the DAY of jury duty and have no openings she needs to be seen as previouslty stated.  pls relay asap so she can get the medical attention she needs

## 2013-08-04 NOTE — Telephone Encounter (Signed)
Noted  

## 2013-09-16 ENCOUNTER — Ambulatory Visit: Payer: BC Managed Care – PPO | Admitting: Family Medicine

## 2013-09-16 ENCOUNTER — Encounter (INDEPENDENT_AMBULATORY_CARE_PROVIDER_SITE_OTHER): Payer: Self-pay

## 2013-09-17 ENCOUNTER — Encounter: Payer: Self-pay | Admitting: Family Medicine

## 2013-09-17 ENCOUNTER — Ambulatory Visit (INDEPENDENT_AMBULATORY_CARE_PROVIDER_SITE_OTHER): Payer: BC Managed Care – PPO | Admitting: Family Medicine

## 2013-09-17 VITALS — BP 108/64 | HR 78 | Resp 16 | Ht 67.0 in | Wt 264.4 lb

## 2013-09-17 DIAGNOSIS — F3289 Other specified depressive episodes: Secondary | ICD-10-CM

## 2013-09-17 DIAGNOSIS — F329 Major depressive disorder, single episode, unspecified: Secondary | ICD-10-CM

## 2013-09-17 DIAGNOSIS — Z9109 Other allergy status, other than to drugs and biological substances: Secondary | ICD-10-CM

## 2013-09-17 DIAGNOSIS — F32A Depression, unspecified: Secondary | ICD-10-CM | POA: Insufficient documentation

## 2013-09-17 MED ORDER — PREDNISONE (PAK) 5 MG PO TABS: 5.0000 mg | ORAL_TABLET | ORAL | Status: DC

## 2013-09-17 MED ORDER — FLUOXETINE HCL 20 MG PO TABS: 20.0000 mg | ORAL_TABLET | Freq: Every day | ORAL | Status: DC

## 2013-09-17 NOTE — Assessment & Plan Note (Addendum)
Start fluoxetine, and refer to therapy 15 mins spent in face to face counselling re management of depression and stress as wellas encouraging pt to verbalize and listening to her most stressful issues

## 2013-09-17 NOTE — Assessment & Plan Note (Signed)
Deteriorated. Patient re-educated about  the importance of commitment to a  minimum of 150 minutes of exercise per week. The importance of healthy food choices with portion control discussed. Encouraged to start a food diary, count calories and to consider  joining a support group. Sample diet sheets offered. Goals set by the patient for the next several months.    

## 2013-09-17 NOTE — Patient Instructions (Signed)
F/u in mid October, call if you need me before  Avoid hot sunlight , you are sun sensitive.Yiou need to use sunscreen at all times also  Try to be out in the sun for early morning and late evening when it is not as hot  New med for depression is fluoxetine one daily  Please use daily physical activity as apart of your depression treatment for 30 mins each day  You are referred for therapy

## 2013-09-17 NOTE — Assessment & Plan Note (Signed)
Avoidance of sunlight, use of sunscreen, prednisone prescribed for as needed use , short term

## 2013-09-17 NOTE — Progress Notes (Signed)
   Subjective:    Patient ID: Laurie Chen, female    DOB: 08/25/1985, 28 y.o.   MRN: [id number]  HPI The PT is here for follow up and re-evaluation of chronic medical conditions, medication management and review of any available recent lab and radiology data.  Preventive health is updated, specifically  Cancer screening and Immunization.   C/o being depressed and stressed, overwhelmed withdrawn and tearful progressively worsening in past 12 month, not suicidal or homicidal C/o rash in the sun, wants medication for this going to the beach for 1 week      Review of Systems ./ch1 Denies recent fever or chills. Denies sinus pressure, nasal congestion, ear pain or sore throat. Denies chest congestion, productive cough or wheezing. Denies chest pains, palpitations and leg swelling Denies abdominal pain, nausea, vomiting,diarrhea or constipation.   Denies dysuria, frequency, hesitancy or incontinence. Denies joint pain, swelling and limitation in mobility. Denies headaches, seizures, numbness, or tingling.        Objective:   Physical Exam BP 108/64  Pulse 78  Resp 16  Ht 5\' 7"  (1.702 m)  Wt 264 lb 6.4 oz (119.931 kg)  BMI 41.40 kg/m2  SpO2 97% Patient alert and oriented and in no cardiopulmonary distress.  HEENT: No facial asymmetry, EOMI,   oropharynx pink and moist.  Neck supple no JVD, no mass.  Chest: Clear to auscultation bilaterally.  CVS: S1, S2 no murmurs, no S3.Regular rate.  ABD: Soft non tender.   Ext: No edema  MS: Adequate ROM spine, shoulders, hips and knees.  Skin: Intact, no ulcerations or rash noted.  Psych: Good eye contact, normal affect. Memory intact not anxious mildly depressed appearing.  CNS: CN 2-12 intact, power,  normal throughout.no focal deficits noted.        Assessment & Plan:  Depression Start fluoxetine, and refer to therapy  Sensitivity to sunlight Avoidance of sunlight, use of sunscreen, prednisone prescribed for as  needed use , short term  Morbid obesity Deteriorated. Patient re-educated about  the importance of commitment to a  minimum of 150 minutes of exercise per week. The importance of healthy food choices with portion control discussed. Encouraged to start a food diary, count calories and to consider  joining a support group. Sample diet sheets offered. Goals set by the patient for the next several months.

## 2013-10-13 ENCOUNTER — Ambulatory Visit (HOSPITAL_COMMUNITY): Payer: BC Managed Care – PPO

## 2013-11-23 ENCOUNTER — Ambulatory Visit: Payer: BC Managed Care – PPO | Admitting: Family Medicine

## 2013-12-17 ENCOUNTER — Telehealth: Payer: Self-pay | Admitting: Family Medicine

## 2013-12-17 NOTE — Telephone Encounter (Signed)
Do you agree with this?  

## 2013-12-17 NOTE — Telephone Encounter (Signed)
Verbal ok received

## 2013-12-17 NOTE — Telephone Encounter (Signed)
Pt to be evaluated for knee pain

## 2013-12-18 ENCOUNTER — Telehealth: Payer: Self-pay | Admitting: Family Medicine

## 2013-12-23 NOTE — Telephone Encounter (Signed)
Patient is aware 

## 2014-02-26 ENCOUNTER — Ambulatory Visit (HOSPITAL_COMMUNITY): Payer: BC Managed Care – PPO | Admitting: Physical Therapy

## 2014-04-05 ENCOUNTER — Emergency Department (HOSPITAL_COMMUNITY)
Admission: EM | Admit: 2014-04-05 | Discharge: 2014-04-05 | Disposition: A | Discharge status: Home / Self Care | Payer: 59 | Attending: Emergency Medicine | Admitting: Emergency Medicine

## 2014-04-05 ENCOUNTER — Encounter (HOSPITAL_COMMUNITY): Payer: Self-pay

## 2014-04-05 DIAGNOSIS — J069 Acute upper respiratory infection, unspecified: Secondary | ICD-10-CM | POA: Diagnosis not present

## 2014-04-05 DIAGNOSIS — E669 Obesity, unspecified: Secondary | ICD-10-CM | POA: Diagnosis not present

## 2014-04-05 DIAGNOSIS — M791 Myalgia: Secondary | ICD-10-CM | POA: Diagnosis not present

## 2014-04-05 DIAGNOSIS — Z7952 Long term (current) use of systemic steroids: Secondary | ICD-10-CM | POA: Insufficient documentation

## 2014-04-05 DIAGNOSIS — Z79899 Other long term (current) drug therapy: Secondary | ICD-10-CM | POA: Insufficient documentation

## 2014-04-05 DIAGNOSIS — R0981 Nasal congestion: Secondary | ICD-10-CM | POA: Diagnosis present

## 2014-04-05 MED ORDER — HYDROCODONE-HOMATROPINE 5-1.5 MG/5ML PO SYRP: 5.0000 mL | ORAL_SOLUTION | Freq: Four times a day (QID) | ORAL | Status: DC | PRN

## 2014-04-05 MED ORDER — IBUPROFEN 600 MG PO TABS: 600.0000 mg | ORAL_TABLET | Freq: Four times a day (QID) | ORAL | Status: DC | PRN

## 2014-04-05 MED ORDER — LORATADINE-PSEUDOEPHEDRINE ER 5-120 MG PO TB12: 1.0000 | ORAL_TABLET | Freq: Two times a day (BID) | ORAL | Status: DC

## 2014-04-05 NOTE — ED Provider Notes (Signed)
CSN: [id number]     Arrival date & time 04/05/14  [id number] History   First MD Initiated Contact with Patient 04/05/14 [phone]     Chief Complaint  Patient presents with  . URI     (Consider location/radiation/quality/duration/timing/severity/associated sxs/prior Treatment) Patient is a 29 y.o. female presenting with URI. The history is provided by the patient.  URI Presenting symptoms: congestion, cough, fatigue, fever and rhinorrhea   Severity:  Moderate Onset quality:  Gradual Duration:  3 days Timing:  Intermittent Progression:  Worsening Chronicity:  New Relieved by:  Rest Worsened by:  Certain positions (straining) Ineffective treatments:  None tried Associated symptoms: myalgias and sneezing   Associated symptoms: no wheezing   Risk factors: sick contacts   Risk factors: no chronic respiratory disease, no immunosuppression and no recent travel     Past Medical History  Diagnosis Date  . Obesity   . Encounter for treatment of vasovagal complication due to treatment for reproductive health condition associated with oral contraception, patch, or vaginal ring    History reviewed. No pertinent past surgical history. Family History  Problem Relation Age of Onset  . Hypertension Mother   . Hypertension Father    History  Substance Use Topics  . Smoking status: Never Smoker   . Smokeless tobacco: Not on file  . Alcohol Use: No   OB History    No data available     Review of Systems  Constitutional: Positive for fever and fatigue.  HENT: Positive for congestion, rhinorrhea and sneezing.   Respiratory: Positive for cough. Negative for wheezing.   Musculoskeletal: Positive for myalgias.  All other systems reviewed and are negative.     Allergies  Other  Home Medications   Prior to Admission medications   Medication Sig Start Date End Date Taking? Authorizing Provider  etonogestrel (IMPLANON) 68 MG IMPL implant Inject 1 each (68 mg total) into the skin once.  03/23/13  Yes Kerri PerchesMargaret E Simpson, MD  ibuprofen (ADVIL,MOTRIN) 200 MG tablet Take 400 mg by mouth every 6 (six) hours as needed for headache.   Yes Historical Provider, MD  Phenyleph-Doxylamine-DM-APAP (ALKA SELTZER PLUS PO) Take 2 capsules by mouth daily as needed (cold symptoms).   Yes Historical Provider, MD  FLUoxetine (PROZAC) 20 MG tablet Take 1 tablet (20 mg total) by mouth daily. Patient not taking: Reported on 04/05/2014 09/17/13   Kerri PerchesMargaret E Simpson, MD  ibuprofen (ADVIL,MOTRIN) 800 MG tablet Take 1 tablet (800 mg total) by mouth 3 (three) times daily. Patient not taking: Reported on 04/05/2014 07/31/13   Kerri PerchesMargaret E Simpson, MD  predniSONE (STERAPRED UNI-PAK) 5 MG TABS tablet Take 1 tablet (5 mg total) by mouth as directed. Patient not taking: Reported on 04/05/2014 09/17/13   Kerri PerchesMargaret E Simpson, MD   BP 118/83 mmHg  Pulse 70  Temp(Src) 98.1 F (36.7 C) (Oral)  Resp 20  Ht 5\' 6"  (1.676 m)  Wt 250 lb (113.399 kg)  BMI 40.37 kg/m2  SpO2 99% Physical Exam  Constitutional: She is oriented to person, place, and time. She appears well-developed and well-nourished.  Non-toxic appearance.  HENT:  Head: Normocephalic.  Right Ear: Tympanic membrane and external ear normal.  Left Ear: Tympanic membrane and external ear normal.  Nasal congestion  Eyes: EOM and lids are normal. Pupils are equal, round, and reactive to light.  Neck: Normal range of motion. Neck supple. Carotid bruit is not present.  Cardiovascular: Normal rate, regular rhythm, normal heart sounds, intact distal pulses and normal pulses.  Exam reveals no gallop and no friction rub.   No murmur heard. Pulmonary/Chest: Breath sounds normal. No stridor. No respiratory distress.  Abdominal: Soft. Bowel sounds are normal. There is no tenderness. There is no guarding.  Musculoskeletal: Normal range of motion. She exhibits no tenderness.  Neg Homan's sign.  Lymphadenopathy:       Head (right side): No submandibular adenopathy  present.       Head (left side): No submandibular adenopathy present.    She has no cervical adenopathy.  Neurological: She is alert and oriented to person, place, and time. She has normal strength. No cranial nerve deficit or sensory deficit. She exhibits normal muscle tone. Coordination normal.  Skin: Skin is warm and dry. No rash noted.  Psychiatric: She has a normal mood and affect. Her speech is normal.  Nursing note and vitals reviewed.   ED Course  Procedures (including critical care time) Labs Review Labs Reviewed - No data to display  Imaging Review No results found.   EKG Interpretation None      MDM  Exam is c/w URI. Pt to wash hands frequently. Use a mask, and use tylenol for fever and aching. Rx for claritin D and promethazine codeine cough medication.   Final diagnoses:  None    **I have reviewed nursing notes, vital signs, and all appropriate lab and imaging results for this patient.Kathie Dike, PA-C 04/05/14 1102  Benny Lennert, MD 04/05/14 1414

## 2014-04-05 NOTE — Discharge Instructions (Signed)
Upper Respiratory Infection, Adult An upper respiratory infection (URI) is also sometimes known as the common cold. The upper respiratory tract includes the nose, sinuses, throat, trachea, and bronchi. Bronchi are the airways leading to the lungs. Most people improve within 1 week, but symptoms can last up to 2 weeks. A residual cough may last even longer.  CAUSES Many different viruses can infect the tissues lining the upper respiratory tract. The tissues become irritated and inflamed and often become very moist. Mucus production is also common. A cold is contagious. You can easily spread the virus to others by oral contact. This includes kissing, sharing a glass, coughing, or sneezing. Touching your mouth or nose and then touching a surface, which is then touched by another person, can also spread the virus. SYMPTOMS  Symptoms typically develop 1 to 3 days after you come in contact with a cold virus. Symptoms vary from person to person. They may include:  Runny nose.  Sneezing.  Nasal congestion.  Sinus irritation.  Sore throat.  Loss of voice (laryngitis).  Cough.  Fatigue.  Muscle aches.  Loss of appetite.  Headache.  Low-grade fever. DIAGNOSIS  You might diagnose your own cold based on familiar symptoms, since most people get a cold 2 to 3 times a year. Your caregiver can confirm this based on your exam. Most importantly, your caregiver can check that your symptoms are not due to another disease such as strep throat, sinusitis, pneumonia, asthma, or epiglottitis. Blood tests, throat tests, and X-rays are not necessary to diagnose a common cold, but they may sometimes be helpful in excluding other more serious diseases. Your caregiver will decide if any further tests are required. RISKS AND COMPLICATIONS  You may be at risk for a more severe case of the common cold if you smoke cigarettes, have chronic heart disease (such as heart failure) or lung disease (such as asthma), or if  you have a weakened immune system. The very young and very old are also at risk for more serious infections. Bacterial sinusitis, middle ear infections, and bacterial pneumonia can complicate the common cold. The common cold can worsen asthma and chronic obstructive pulmonary disease (COPD). Sometimes, these complications can require emergency medical care and may be life-threatening. PREVENTION  The best way to protect against getting a cold is to practice good hygiene. Avoid oral or hand contact with people with cold symptoms. Wash your hands often if contact occurs. There is no clear evidence that vitamin C, vitamin E, echinacea, or exercise reduces the chance of developing a cold. However, it is always recommended to get plenty of rest and practice good nutrition. TREATMENT  Treatment is directed at relieving symptoms. There is no cure. Antibiotics are not effective, because the infection is caused by a virus, not by bacteria. Treatment may include:  Increased fluid intake. Sports drinks offer valuable electrolytes, sugars, and fluids.  Breathing heated mist or steam (vaporizer or shower).  Eating chicken soup or other clear broths, and maintaining good nutrition.  Getting plenty of rest.  Using gargles or lozenges for comfort.  Controlling fevers with ibuprofen or acetaminophen as directed by your caregiver.  Increasing usage of your inhaler if you have asthma. Zinc gel and zinc lozenges, taken in the first 24 hours of the common cold, can shorten the duration and lessen the severity of symptoms. Pain medicines may help with fever, muscle aches, and throat pain. A variety of non-prescription medicines are available to treat congestion and runny nose. Your caregiver   can make recommendations and may suggest nasal or lung inhalers for other symptoms.  HOME CARE INSTRUCTIONS   Only take over-the-counter or prescription medicines for pain, discomfort, or fever as directed by your  caregiver.  Use a warm mist humidifier or inhale steam from a shower to increase air moisture. This may keep secretions moist and make it easier to breathe.  Drink enough water and fluids to keep your urine clear or pale yellow.  Rest as needed.  Return to work when your temperature has returned to normal or as your caregiver advises. You may need to stay home longer to avoid infecting others. You can also use a face mask and careful hand washing to prevent spread of the virus. SEEK MEDICAL CARE IF:   After the first few days, you feel you are getting worse rather than better.  You need your caregiver's advice about medicines to control symptoms.  You develop chills, worsening shortness of breath, or brown or red sputum. These may be signs of pneumonia.  You develop yellow or brown nasal discharge or pain in the face, especially when you bend forward. These may be signs of sinusitis.  You develop a fever, swollen neck glands, pain with swallowing, or white areas in the back of your throat. These may be signs of strep throat. SEEK IMMEDIATE MEDICAL CARE IF:   You have a fever.  You develop severe or persistent headache, ear pain, sinus pain, or chest pain.  You develop wheezing, a prolonged cough, cough up blood, or have a change in your usual mucus (if you have chronic lung disease).  You develop sore muscles or a stiff neck. Document Released: 07/18/2000 Document Revised: 04/16/2011 Document Reviewed: 04/29/2013 [employer] Patient Information 2015 [employer]. This information is not intended to replace advice given to you by your health care provider. Make sure you discuss any questions you have with your health care provider.  

## 2014-04-05 NOTE — ED Notes (Signed)
Pt c/o cough, congestions, intermittent fever, sore throat since Friday.  Reports sneezed this morning and blood tinged mucus came out of nose.

## 2014-04-07 ENCOUNTER — Ambulatory Visit (HOSPITAL_COMMUNITY)
Admission: RE | Admit: 2014-04-07 | Discharge: 2014-04-07 | Disposition: A | Discharge status: Home / Self Care | Payer: 59 | Source: Ambulatory Visit | Attending: Endocrinology | Admitting: Endocrinology

## 2014-04-07 ENCOUNTER — Other Ambulatory Visit (HOSPITAL_COMMUNITY): Payer: 59

## 2014-04-07 DIAGNOSIS — E041 Nontoxic single thyroid nodule: Secondary | ICD-10-CM | POA: Insufficient documentation

## 2014-04-12 ENCOUNTER — Telehealth: Payer: Self-pay | Admitting: Family Medicine

## 2014-04-12 DIAGNOSIS — L700 Acne vulgaris: Secondary | ICD-10-CM

## 2014-04-12 DIAGNOSIS — L409 Psoriasis, unspecified: Secondary | ICD-10-CM

## 2014-04-12 DIAGNOSIS — L309 Dermatitis, unspecified: Secondary | ICD-10-CM

## 2014-04-13 NOTE — Telephone Encounter (Signed)
Patient has a breakout in facial rash.  Referral entered.

## 2014-04-13 NOTE — Telephone Encounter (Signed)
Referral has been made and appointment also by patient

## 2014-04-13 NOTE — Telephone Encounter (Signed)
Attempted to reach patient to receive more details.  Phone is not in working order.

## 2014-06-10 ENCOUNTER — Encounter: Payer: Self-pay | Admitting: Family Medicine

## 2014-06-10 ENCOUNTER — Other Ambulatory Visit: Payer: Self-pay | Admitting: Family Medicine

## 2014-06-10 ENCOUNTER — Telehealth: Payer: Self-pay | Admitting: *Deleted

## 2014-06-10 ENCOUNTER — Ambulatory Visit (INDEPENDENT_AMBULATORY_CARE_PROVIDER_SITE_OTHER): Payer: 59 | Admitting: Family Medicine

## 2014-06-10 VITALS — BP 130/74 | HR 99 | Temp 99.3°F | Resp 18 | Ht 67.0 in | Wt 285.0 lb

## 2014-06-10 DIAGNOSIS — R7303 Prediabetes: Secondary | ICD-10-CM

## 2014-06-10 DIAGNOSIS — E559 Vitamin D deficiency, unspecified: Secondary | ICD-10-CM | POA: Diagnosis not present

## 2014-06-10 DIAGNOSIS — D649 Anemia, unspecified: Secondary | ICD-10-CM

## 2014-06-10 DIAGNOSIS — F329 Major depressive disorder, single episode, unspecified: Secondary | ICD-10-CM

## 2014-06-10 DIAGNOSIS — J0110 Acute frontal sinusitis, unspecified: Secondary | ICD-10-CM | POA: Diagnosis not present

## 2014-06-10 DIAGNOSIS — Z13220 Encounter for screening for lipoid disorders: Secondary | ICD-10-CM

## 2014-06-10 DIAGNOSIS — R946 Abnormal results of thyroid function studies: Secondary | ICD-10-CM

## 2014-06-10 DIAGNOSIS — J1189 Influenza due to unidentified influenza virus with other manifestations: Secondary | ICD-10-CM

## 2014-06-10 DIAGNOSIS — K219 Gastro-esophageal reflux disease without esophagitis: Secondary | ICD-10-CM | POA: Diagnosis not present

## 2014-06-10 DIAGNOSIS — F32A Depression, unspecified: Secondary | ICD-10-CM

## 2014-06-10 DIAGNOSIS — R7309 Other abnormal glucose: Secondary | ICD-10-CM

## 2014-06-10 DIAGNOSIS — J111 Influenza due to unidentified influenza virus with other respiratory manifestations: Secondary | ICD-10-CM | POA: Insufficient documentation

## 2014-06-10 MED ORDER — PENICILLIN V POTASSIUM 500 MG PO TABS: 500.0000 mg | ORAL_TABLET | Freq: Three times a day (TID) | ORAL | Status: DC

## 2014-06-10 MED ORDER — CEFTRIAXONE SODIUM 1 G IJ SOLR
500.0000 mg | Freq: Once | INTRAMUSCULAR | Status: AC
  Administered 2014-06-10: 500 mg via INTRAMUSCULAR

## 2014-06-10 MED ORDER — OSELTAMIVIR PHOSPHATE 75 MG PO CAPS: 75.0000 mg | ORAL_CAPSULE | Freq: Two times a day (BID) | ORAL | Status: DC

## 2014-06-10 NOTE — Telephone Encounter (Signed)
Pt called stating she has been sick since Tuesday with a fever, pt is requesting to be seen today. Please advise

## 2014-06-10 NOTE — Telephone Encounter (Signed)
appt made for today 

## 2014-06-10 NOTE — Progress Notes (Signed)
   Subjective:    Patient ID: Laurie Chen, female    DOB: March 11, 1985, 29 y.o.   MRN: [id number]  HPI Fever up to 101 and sore throat on 5/2, has used tylenol and ibuprofen to maintain normal  Temps" on the job.Over the following days as of yesterday developed body aches, no productive cough, excessive sneezing , yellow green from nose with frontal pressure, and temp 103 this am  Review of Systems See HPI Denies chest congestion, productive cough or wheezing. Denies chest pains, palpitations and leg swelling Denies abdominal pain, nausea, vomiting,diarrhea or constipation.   Denies dysuria, frequency, hesitancy or incontinence. Denies headaches, seizures, numbness, or tingling. Denies depression, anxiety or insomnia. Denies skin break down or rash.        Objective:   Physical Exam BP 130/74 mmHg  Pulse 99  Temp(Src) 99.3 F (37.4 C)  Resp 18  Ht 5\' 7"  (1.702 m)  Wt 285 lb 0.6 oz (129.293 kg)  BMI 44.63 kg/m2  SpO2 98% Patient alert and oriented and in no cardiopulmonary distress.Ill appearing  HEENT: No facial asymmetry, EOMI,   oropharynx pink and moist.  Neck supple no JVD, no mass. Frontal sinus tender, TM clear, bilateral cerv8ical adenitis Chest: Clear to auscultation bilaterally.  CVS: S1, S2 no murmurs, no S3.Regular rate.  ABD: Soft non tender.   Ext: No edema  MS: Adequate ROM spine, shoulders, hips and knees.  Skin: Intact, no ulcerations or rash noted.  Psych: Good eye contact, normal affect. Memory intact not anxious or depressed appearing.  CNS: CN 2-12 intact, power,  normal throughout.no focal deficits noted.        Assessment & Plan:  Influenza with respiratory manifestations Tamiflu prescribed and work excuse for 3 days Symptomatic treatment discussed and info provided in writing also   Acute frontal sinusitis Antibiotic course prescribed, and twice daily saline flushes recommended also   Vitamin D deficiency Updated lab needed,  will supplement if needed   Morbid obesity Deteriorated. Patient re-educated about  the importance of commitment to a  minimum of 150 minutes of exercise per week.  The importance of healthy food choices with portion control discussed. Encouraged to start a food diary, count calories and to consider  joining a support group. Sample diet sheets offered. Goals set by the patient for the next several months.   Weight /BMI 06/10/2014 04/05/2014 09/17/2013  WEIGHT 285 lb 0.6 oz 250 lb 264 lb 6.4 oz  HEIGHT 5\' 7"  5\' 6"  5\' 7"   BMI 44.63 kg/m2 40.37 kg/m2 41.4 kg/m2    Current exercise per week 90 minutes.    Depression  depression triggered by personal and family life circumstances, doing well on current med   Anemia Iron and ferritin needs to be checked, recommend daily MVI if not specifically iron deficient

## 2014-06-10 NOTE — Patient Instructions (Addendum)
F/u in 4 month, call if you need me before  You are treated for influenza and acute sinusitis Rocephin in office today, followed by penicillin for 10 days  Tamiflu prescribed for influenza  Will wait on lab report before prescribing heartburn med  Labs today please Breath test, CBC, cmp,lipid panel, TSH and Vit D and HBA1C  Work excuse from today to return May 9  Fluids and rest. It is important that you exercise regularly at least 30 minutes 5 times a week. If you develop chest pain, have severe difficulty breathing, or feel very tired, stop exercising immediately and seek medical attention   Change eating habits as we discussed please   Influenza Influenza ("the flu") is a viral infection of the respiratory tract. It occurs more often in winter months because people spend more time in close contact with one another. Influenza can make you feel very sick. Influenza easily spreads from person to person (contagious). CAUSES  Influenza is caused by a virus that infects the respiratory tract. You can catch the virus by breathing in droplets from an infected person's cough or sneeze. You can also catch the virus by touching something that was recently contaminated with the virus and then touching your mouth, nose, or eyes. RISKS AND COMPLICATIONS You may be at risk for a more severe case of influenza if you smoke cigarettes, have diabetes, have chronic heart disease (such as heart failure) or lung disease (such as asthma), or if you have a weakened immune system. Elderly people and pregnant women are also at risk for more serious infections. The most common problem of influenza is a lung infection (pneumonia). Sometimes, this problem can require emergency medical care and may be life threatening. SIGNS AND SYMPTOMS  Symptoms typically last 4 to 10 days and may include:  Fever.  Chills.  Headache, body aches, and muscle aches.  Sore throat.  Chest discomfort and cough.  Poor  appetite.  Weakness or feeling tired.  Dizziness.  Nausea or vomiting. DIAGNOSIS  Diagnosis of influenza is often made based on your history and a physical exam. A nose or throat swab test can be done to confirm the diagnosis. TREATMENT  In mild cases, influenza goes away on its own. Treatment is directed at relieving symptoms. For more severe cases, your health care provider may prescribe antiviral medicines to shorten the sickness. Antibiotic medicines are not effective because the infection is caused by a virus, not by bacteria. HOME CARE INSTRUCTIONS  Take medicines only as directed by your health care provider.  Use a cool mist humidifier to make breathing easier.  Get plenty of rest until your temperature returns to normal. This usually takes 3 to 4 days.  Drink enough fluid to keep your urine clear or pale yellow.  Cover yourmouth and nosewhen coughing or sneezing,and wash your handswellto prevent thevirusfrom spreading.  Stay homefromwork orschool untilthe fever is gonefor at least 481full day. PREVENTION  An annual influenza vaccination (flu shot) is the best way to avoid getting influenza. An annual flu shot is now routinely recommended for all adults in the [place] SEEK MEDICAL CARE IF:  You experiencechest pain, yourcough worsens,or you producemore mucus.  Youhave nausea,vomiting, ordiarrhea.  Your fever returns or gets worse. SEEK IMMEDIATE MEDICAL CARE IF:  You havetrouble breathing, you become short of breath,or your skin ornails becomebluish.  You have severe painor stiffnessin the neck.  You develop a sudden headache, or pain in the face or ear.  You have nausea or  vomiting that you cannot control. MAKE SURE YOU:   Understand these instructions.  Will watch your condition.  Will get help right away if you are not doing well or get worse. Document Released: 01/20/2000 Document Revised: 06/08/2013 Document Reviewed:  04/23/2011 [facility] Patient Information 2015 [employer], [place]. This information is not intended to replace advice given to you by your health care provider. Make sure you discuss any questions you have with your health care provider.

## 2014-06-11 LAB — COMPREHENSIVE METABOLIC PANEL
ALT: 13 U/L (ref 0–35)
AST: 17 U/L (ref 0–37)
Albumin: 3.7 g/dL (ref 3.5–5.2)
Alkaline Phosphatase: 95 U/L (ref 39–117)
BUN: 8 mg/dL (ref 6–23)
CALCIUM: 8.6 mg/dL (ref 8.4–10.5)
CHLORIDE: 103 meq/L (ref 96–112)
CO2: 26 mEq/L (ref 19–32)
CREATININE: 0.71 mg/dL (ref 0.50–1.10)
Glucose, Bld: 76 mg/dL (ref 70–99)
POTASSIUM: 3.8 meq/L (ref 3.5–5.3)
SODIUM: 139 meq/L (ref 135–145)
TOTAL PROTEIN: 7.2 g/dL (ref 6.0–8.3)
Total Bilirubin: 0.2 mg/dL (ref 0.2–1.2)

## 2014-06-11 LAB — CBC
HEMATOCRIT: 36.9 % (ref 36.0–46.0)
Hemoglobin: 11.3 g/dL — ABNORMAL LOW (ref 12.0–15.0)
MCH: 20.7 pg — ABNORMAL LOW (ref 26.0–34.0)
MCHC: 30.6 g/dL (ref 30.0–36.0)
MCV: 67.6 fL — ABNORMAL LOW (ref 78.0–100.0)
MPV: 9.6 fL (ref 8.6–12.4)
Platelets: 370 10*3/uL (ref 150–400)
RBC: 5.46 MIL/uL — ABNORMAL HIGH (ref 3.87–5.11)
RDW: 16.1 % — AB (ref 11.5–15.5)
WBC: 5.2 10*3/uL (ref 4.0–10.5)

## 2014-06-11 LAB — HEMOGLOBIN A1C
HEMOGLOBIN A1C: 5.8 % — AB (ref <5.7)
Mean Plasma Glucose: 120 mg/dL — ABNORMAL HIGH (ref <117)

## 2014-06-11 LAB — TSH: TSH: 1.235 u[IU]/mL (ref 0.350–4.500)

## 2014-06-11 LAB — LIPID PANEL
CHOL/HDL RATIO: 2.8 ratio
Cholesterol: 91 mg/dL (ref 0–200)
HDL: 33 mg/dL — ABNORMAL LOW (ref >=46)
LDL Cholesterol: 39 mg/dL (ref 0–99)
Triglycerides: 96 mg/dL (ref <150)
VLDL: 19 mg/dL (ref 0–40)

## 2014-06-11 LAB — H. PYLORI BREATH TEST: H. PYLORI BREATH TEST: NOT DETECTED

## 2014-06-11 LAB — VITAMIN D 25 HYDROXY (VIT D DEFICIENCY, FRACTURES): Vit D, 25-Hydroxy: 23 ng/mL — ABNORMAL LOW (ref 30–100)

## 2014-06-14 NOTE — Assessment & Plan Note (Signed)
Deteriorated. Patient re-educated about  the importance of commitment to a  minimum of 150 minutes of exercise per week.  The importance of healthy food choices with portion control discussed. Encouraged to start a food diary, count calories and to consider  joining a support group. Sample diet sheets offered. Goals set by the patient for the next several months.   Weight /BMI 06/10/2014 04/05/2014 09/17/2013  WEIGHT 285 lb 0.6 oz 250 lb 264 lb 6.4 oz  HEIGHT 5\' 7"  5\' 6"  5\' 7"   BMI 44.63 kg/m2 40.37 kg/m2 41.4 kg/m2    Current exercise per week 90 minutes.

## 2014-06-14 NOTE — Assessment & Plan Note (Signed)
Antibiotic course prescribed, and twice daily saline flushes recommended also

## 2014-06-14 NOTE — Assessment & Plan Note (Signed)
Tamiflu prescribed and work excuse for 3 days Symptomatic treatment discussed and info provided in writing also

## 2014-06-14 NOTE — Assessment & Plan Note (Signed)
Updated lab needed, will supplement if needed

## 2014-06-14 NOTE — Assessment & Plan Note (Addendum)
depression triggered by personal and family life circumstances, doing well on current med

## 2014-06-14 NOTE — Assessment & Plan Note (Signed)
Iron and ferritin needs to be checked, recommend daily MVI if not specifically iron deficient

## 2014-06-15 LAB — FERRITIN: Ferritin: 115 ng/mL (ref 10–291)

## 2014-06-15 LAB — IRON: IRON: 15 ug/dL — AB (ref 42–145)

## 2014-06-15 MED ORDER — VITAMIN D (ERGOCALCIFEROL) 1.25 MG (50000 UNIT) PO CAPS: 50000.0000 [IU] | ORAL_CAPSULE | ORAL | Status: DC

## 2014-06-15 NOTE — Addendum Note (Signed)
Addended by: Kandis FantasiaSLADE, COURTNEY B on: 06/15/2014 02:04 PM   Modules accepted: Orders

## 2014-06-15 NOTE — Addendum Note (Signed)
Addended by: Kandis FantasiaSLADE, COURTNEY B on: 06/15/2014 02:06 PM   Modules accepted: Orders

## 2014-06-23 ENCOUNTER — Telehealth: Payer: Self-pay

## 2014-06-23 NOTE — Telephone Encounter (Signed)
Had 10 days of penicillin I recommend advil twice daily for 5 days

## 2014-06-23 NOTE — Telephone Encounter (Signed)
Pt aware.

## 2014-08-10 ENCOUNTER — Telehealth: Payer: Self-pay | Admitting: Family Medicine

## 2014-08-10 DIAGNOSIS — L309 Dermatitis, unspecified: Secondary | ICD-10-CM

## 2014-08-10 NOTE — Telephone Encounter (Signed)
Referral entered  

## 2014-10-13 ENCOUNTER — Ambulatory Visit: Payer: 59 | Admitting: Family Medicine

## 2015-01-05 ENCOUNTER — Ambulatory Visit: Payer: 59 | Admitting: Family Medicine

## 2015-02-16 ENCOUNTER — Ambulatory Visit: Payer: 59 | Admitting: Family Medicine

## 2015-03-16 ENCOUNTER — Encounter: Payer: Self-pay | Admitting: Family Medicine

## 2015-03-16 ENCOUNTER — Ambulatory Visit (INDEPENDENT_AMBULATORY_CARE_PROVIDER_SITE_OTHER): Payer: BLUE CROSS/BLUE SHIELD | Admitting: Family Medicine

## 2015-03-16 DIAGNOSIS — L309 Dermatitis, unspecified: Secondary | ICD-10-CM

## 2015-03-16 DIAGNOSIS — D649 Anemia, unspecified: Secondary | ICD-10-CM

## 2015-03-16 DIAGNOSIS — E559 Vitamin D deficiency, unspecified: Secondary | ICD-10-CM | POA: Diagnosis not present

## 2015-03-16 DIAGNOSIS — F32A Depression, unspecified: Secondary | ICD-10-CM

## 2015-03-16 DIAGNOSIS — R7302 Impaired glucose tolerance (oral): Secondary | ICD-10-CM

## 2015-03-16 DIAGNOSIS — F329 Major depressive disorder, single episode, unspecified: Secondary | ICD-10-CM

## 2015-03-16 MED ORDER — PHENTERMINE HCL 37.5 MG PO TABS: 37.5000 mg | ORAL_TABLET | Freq: Every day | ORAL | Status: DC

## 2015-03-16 NOTE — Progress Notes (Signed)
Subjective:    Patient ID: Laurie Chen, female    DOB: 09-07-1985, 30 y.o.   MRN: [id number]  HPI   Laurie Chen     MRN: [id number]      DOB: [birth year]   HPI [name] is here for follow up and re-evaluation of chronic medical conditions, medication management and review of any available recent lab and radiology data.  Preventive health is updated, specifically  Cancer screening and Immunization.   Questions or concerns regarding consultations or procedures which the PT has had in the interim are  addressed. The PT denies any adverse reactions to current medications since the last visit.  C/o increased weight gain wants appetite suppressant short term to help with weight loss  ROS Denies recent fever or chills. Denies sinus pressure, nasal congestion, ear pain or sore throat. Denies chest congestion, productive cough or wheezing. Denies chest pains, palpitations and leg swelling Denies abdominal pain, nausea, vomiting,diarrhea or constipation.   Denies dysuria, frequency, hesitancy or incontinence. Denies joint pain, swelling and limitation in mobility. Denies headaches, seizures, numbness, or tingling. Denies depression, anxiety or insomnia. Denies skin break down or rash.   PE  BP 114/72 mmHg  Pulse 88  Resp 16  Ht  (1.702 m)  Wt 300 lb (136.079 kg)  BMI 46.98 kg/m2  SpO2 97%  Patient alert and oriented and in no cardiopulmonary distress.  HEENT: No facial asymmetry, EOMI,   oropharynx pink and moist.  Neck supple no JVD, no mass.  Chest: Clear to auscultation bilaterally.  CVS: S1, S2 no murmurs, no S3.Regular rate.  ABD: Soft non tender.   Ext: No edema  MS: Adequate ROM spine, shoulders, hips and knees.  Skin: Intact, no ulcerations or rash noted.  Psych: Good eye contact, normal affect. Memory intact not anxious or depressed appearing.  CNS: CN 2-12 intact, power,  normal throughout.no focal deficits noted.   Assessment &  Plan   Morbid obesity Deteriorated. Patient re-educated about  the importance of commitment to a  minimum of 150 minutes of exercise per week.  The importance of healthy food choices with portion control discussed. Encouraged to start a food diary, count calories and to consider  joining a support group. Sample diet sheets offered. Goals set by the patient for the next several months.   Weight /BMI 03/16/2015 06/10/2014 04/05/2014  WEIGHT 300 lb 285 lb 0.6 oz 250 lb  HEIGHT     BMI 46.98 kg/m2 44.63 kg/m2 40.37 kg/m2   Goals established for expected weight loss to justify use, also advers s/e and black box warning discussed  Current exercise per week 90 Start half phentermine daily  Recently removed progesterone contraceptive device    Vitamin D deficiency Updated lab needed at/ before next visit.   IGT (impaired glucose tolerance) Updated lab needed at/ before next visit. Patient educated about the importance of limiting  Carbohydrate intake , the need to commit to daily physical activity for a minimum of 30 minutes , and to commit weight loss. The fact that changes in all these areas will reduce or eliminate all together the development of diabetes is stressed.   Diabetic Labs Latest Ref Rng 06/10/2014 03/27/2013 02/08/2011 06/20/2007  HbA1c <5.7 % 5.8(H) 5.5 5.6 -  Chol 0 - 200 mg/dL 91 865 784 696  HDL >=29 mg/dL 52(W) 41 47 47  Calc LDL 0 - 99 mg/dL 39 56 73 64  Triglycerides <150 mg/dL 96 74 68  106  Creatinine 0.50 - 1.10 mg/dL 4.09 8.11 9.14 7.82   BP/Weight 03/16/2015 06/10/2014 04/05/2014 09/17/2013 03/23/2013 06/02/2012 04/03/2012  Systolic BP 114 130 133 108 106 120 120  Diastolic BP 72 74 86 64 70 70 78  Wt. (Lbs) 300 285.04 250 264.4 254.12 254.12 267  BMI 46.98 44.63 40.37 41.4 41.04 41.04 43.12   No flowsheet data found.     Eczema Stable no recent flare  Depression resoved and she discontinued medication approx 4 months ago      Review of  Systems     Objective:   Physical Exam        Assessment & Plan:

## 2015-03-16 NOTE — Patient Instructions (Signed)
F/u in 3 month, 3 weeks, call if you need me sooner  Please work on good  health habits so that your health will improve. 1. Commitment to daily physical activity for 30 to 60  minutes, if you are able to do this.  2. Commitment to wise food choices. Aim for half of your  food intake to be vegetable and fruit, one quarter starchy foods, and one quarter protein. Try to eat on a regular schedule  3 meals per day, snacking between meals should be limited to vegetables or fruits or small portions of nuts. 64 ounces of water per day is generally recommended, unless you have specific health conditions, like heart failure or kidney failure where you will need to limit fluid intake.  3. Commitment to sufficient and a  good quality of physical and mental rest daily, generally between 6 to 8 hours per day.  WITH PERSISTANCE AND PERSEVERANCE, THE IMPOSSIBLE , BECOMES THE NORM!   New is phentermine tab take HALF daily ( script says one), and remember if you develop adverse side effects as I discussed stop the medication and let me know  ENSURE NO PREGNANCY while taking the phentermine please  Weight loss goal of 5 pounds per month  1500 cal per day diet  Fasting labs 1 week before follow up visit  Weight loss goal of 5 pounds per month  Thanks for choosing [facility], we consider it a privelige to serve you. All the best!

## 2015-03-17 ENCOUNTER — Telehealth: Payer: Self-pay | Admitting: Family Medicine

## 2015-03-17 MED ORDER — VITAMIN D (ERGOCALCIFEROL) 1.25 MG (50000 UNIT) PO CAPS: 50000.0000 [IU] | ORAL_CAPSULE | ORAL | Status: DC

## 2015-03-17 NOTE — Telephone Encounter (Signed)
Patient states the 2 prescriptions that Dr. Lodema Hong prescribed wasn't called in yesterday to [facility], please advise?

## 2015-03-17 NOTE — Telephone Encounter (Signed)
Resent

## 2015-03-21 DIAGNOSIS — R7302 Impaired glucose tolerance (oral): Secondary | ICD-10-CM | POA: Insufficient documentation

## 2015-03-21 NOTE — Assessment & Plan Note (Signed)
resoved and she discontinued medication approx 4 months ago

## 2015-03-21 NOTE — Assessment & Plan Note (Signed)
Stable no recent flare 

## 2015-03-21 NOTE — Assessment & Plan Note (Signed)
Updated lab needed at/ before next visit.   

## 2015-03-21 NOTE — Assessment & Plan Note (Signed)
Deteriorated. Patient re-educated about  the importance of commitment to a  minimum of 150 minutes of exercise per week.  The importance of healthy food choices with portion control discussed. Encouraged to start a food diary, count calories and to consider  joining a support group. Sample diet sheets offered. Goals set by the patient for the next several months.   Weight /BMI 03/16/2015 06/10/2014 04/05/2014  WEIGHT 300 lb 285 lb 0.6 oz 250 lb  HEIGHT     BMI 46.98 kg/m2 44.63 kg/m2 40.37 kg/m2   Goals established for expected weight loss to justify use, also advers s/e and black box warning discussed  Current exercise per week 90 Start half phentermine daily  Recently removed progesterone contraceptive device

## 2015-03-21 NOTE — Assessment & Plan Note (Signed)
Updated lab needed at/ before next visit. Patient educated about the importance of limiting  Carbohydrate intake , the need to commit to daily physical activity for a minimum of 30 minutes , and to commit weight loss. The fact that changes in all these areas will reduce or eliminate all together the development of diabetes is stressed.   Diabetic Labs Latest Ref Rng 06/10/2014 03/27/2013 02/08/2011 06/20/2007  HbA1c <5.7 % 5.8(H) 5.5 5.6 -  Chol 0 - 200 mg/dL 91 409 811 914  HDL >=78 mg/dL 29(F) 41 47 47  Calc LDL 0 - 99 mg/dL 39 56 73 64  Triglycerides <150 mg/dL 96 74 68 621  Creatinine 0.50 - 1.10 mg/dL 3.08 6.57 8.46 9.62   BP/Weight 03/16/2015 06/10/2014 04/05/2014 09/17/2013 03/23/2013 06/02/2012 04/03/2012  Systolic BP 114 130 133 108 106 120 120  Diastolic BP 72 74 86 64 70 70 78  Wt. (Lbs) 300 285.04 250 264.4 254.12 254.12 267  BMI 46.98 44.63 40.37 41.4 41.04 41.04 43.12   No flowsheet data found.

## 2015-03-31 ENCOUNTER — Telehealth: Payer: Self-pay | Admitting: Family Medicine

## 2015-03-31 NOTE — Telephone Encounter (Signed)
#   listed is not in working order .

## 2015-03-31 NOTE — Telephone Encounter (Signed)
Patient is calling stating that her insurance will not pay for the medication phentermine (ADIPEX-P) 37.5 MG tablet please advise?

## 2015-04-01 NOTE — Telephone Encounter (Signed)
Number is not in working.  Attempted to reach her emergency contact but she does not have a voicemail setup

## 2015-06-29 ENCOUNTER — Ambulatory Visit: Payer: BC Managed Care – PPO | Admitting: Family Medicine

## 2015-06-30 IMAGING — US US SOFT TISSUE HEAD/NECK
1 series · 14 of 25 positions shown · non-contrast
Comparison: None.

CLINICAL DATA: Elevated thyroid function test.  Hair loss.

EXAM:
THYROID ULTRASOUND
TECHNIQUE: Ultrasound examination of the thyroid gland and adjacent soft
tissues was performed.

[Series 1: us soft tissue head/neck · 0.06mm/px · 14 of 47 slices shown]
[im 1/47]
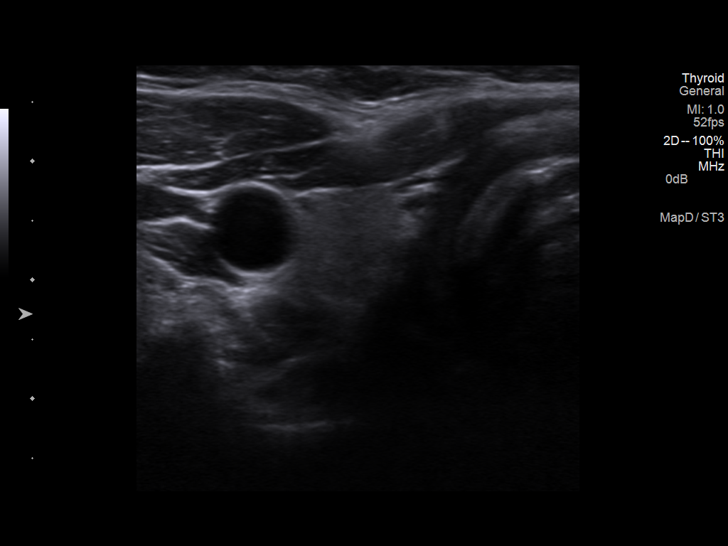
[im 4/47]
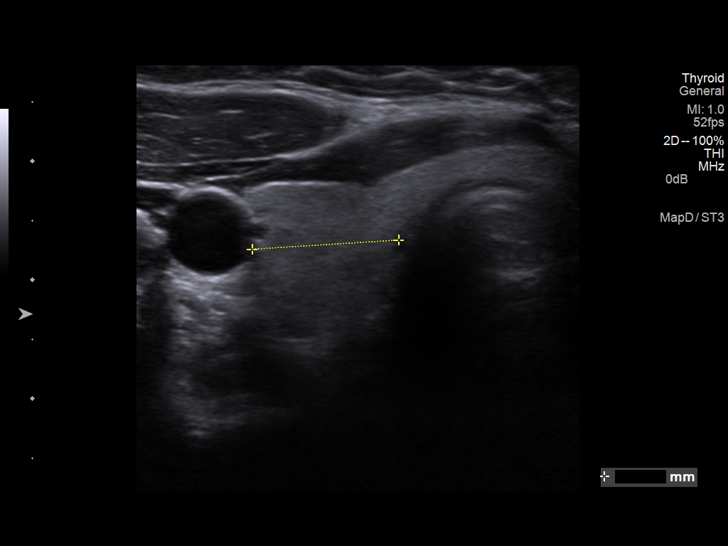
[im 8/47]
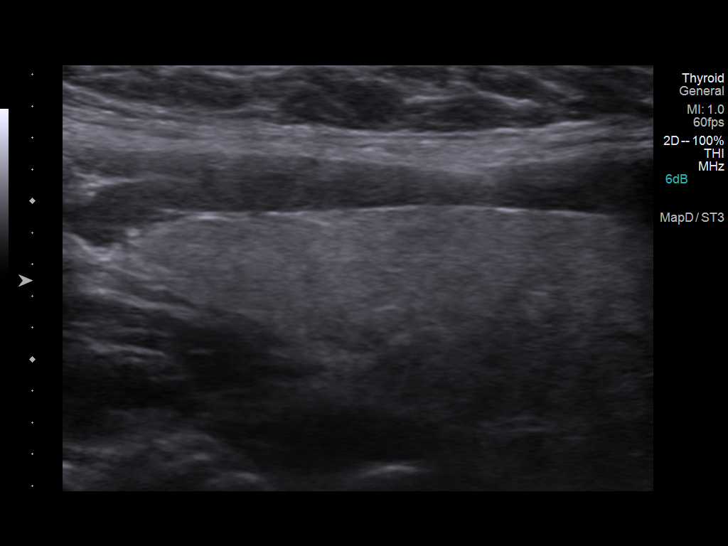
[im 12/47]
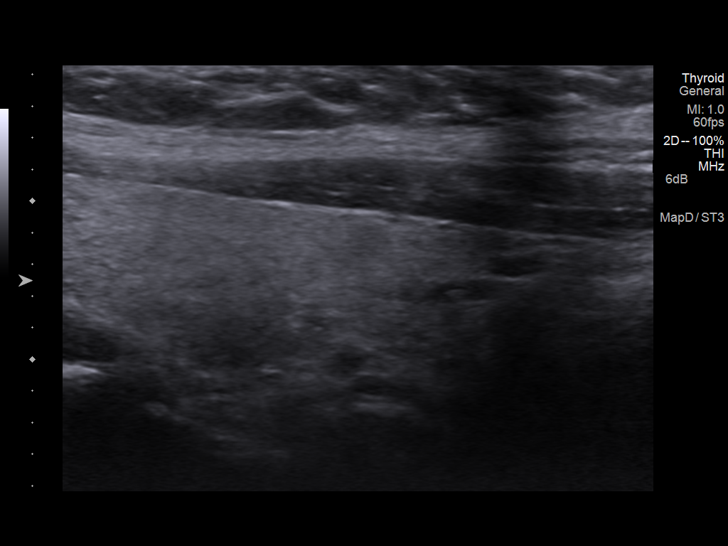
[im 16/47]
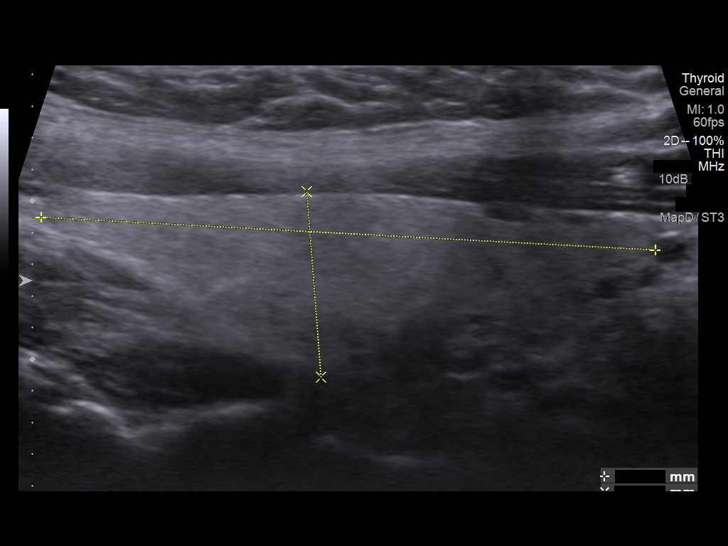
[im 18/47]
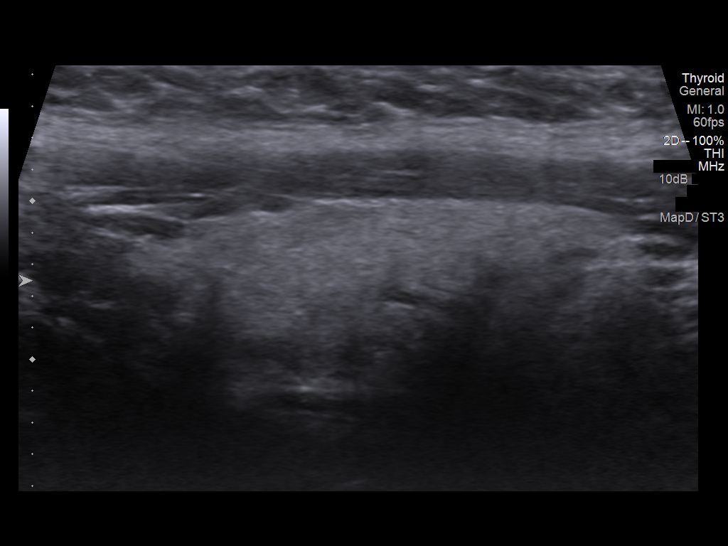
[im 22/47]
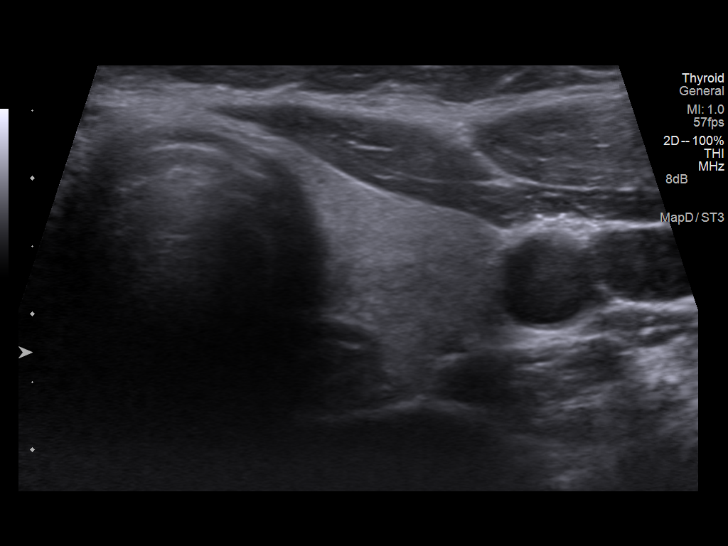
[im 25/47]
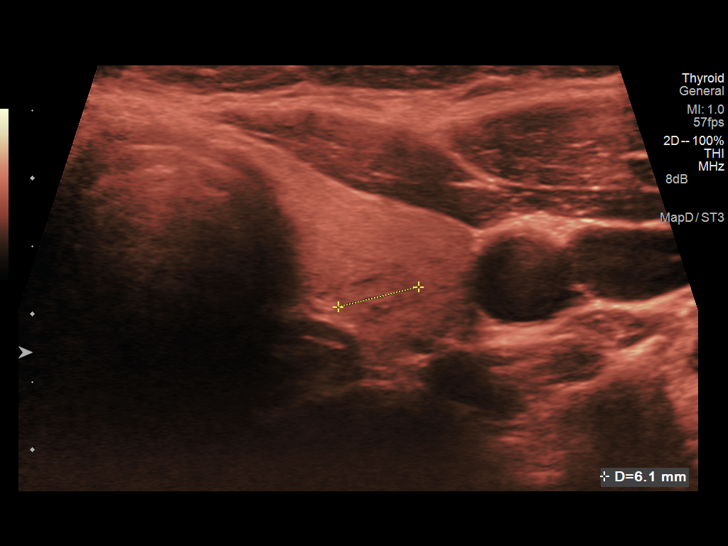
[im 29/47]
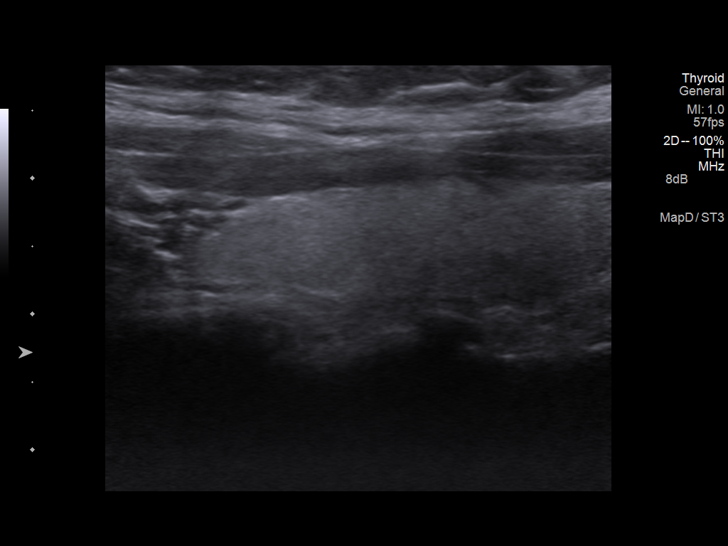
[im 31/47]
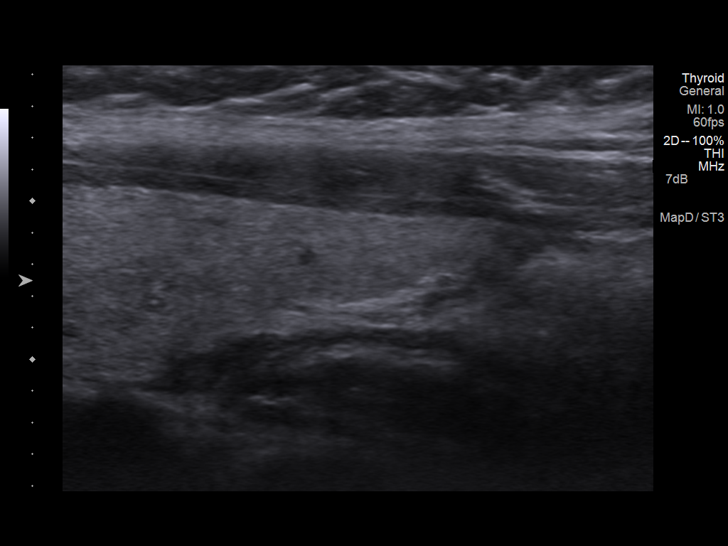
[im 35/47]
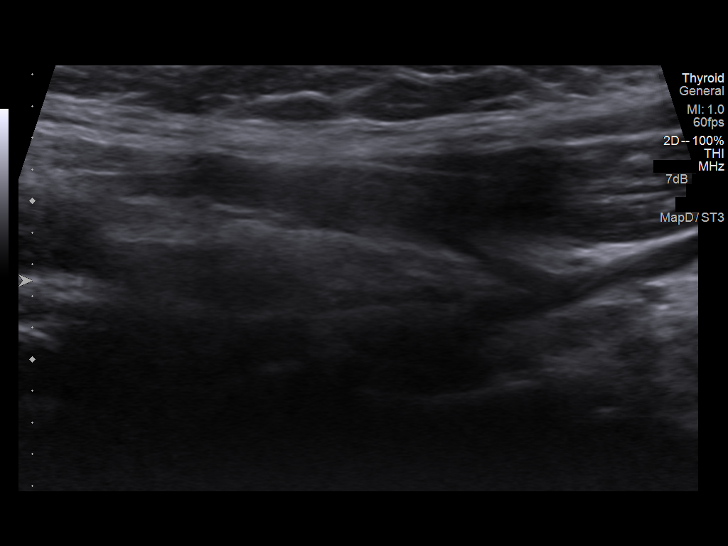
[im 39/47]
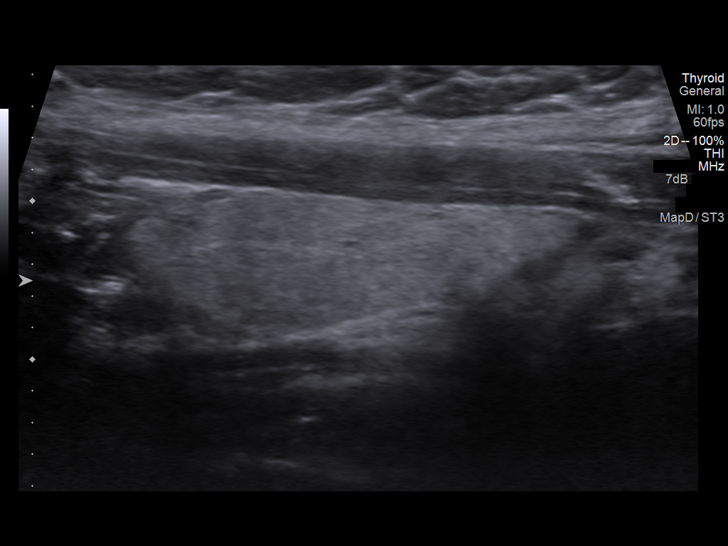
[im 43/47]
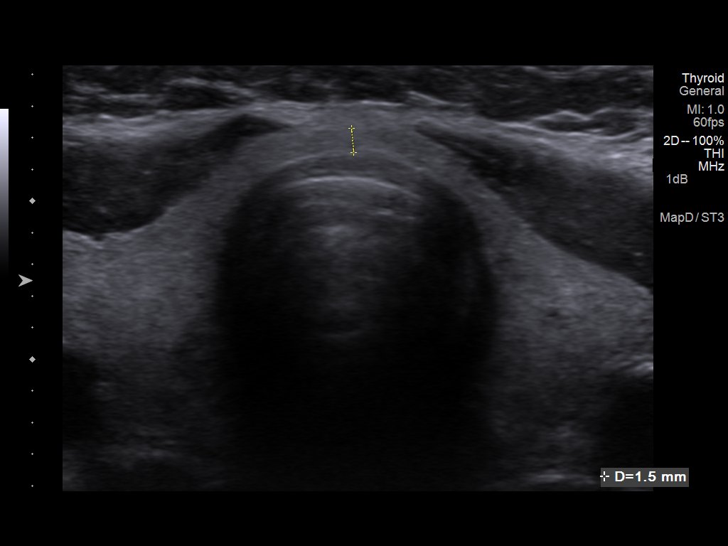
[im 47/47]
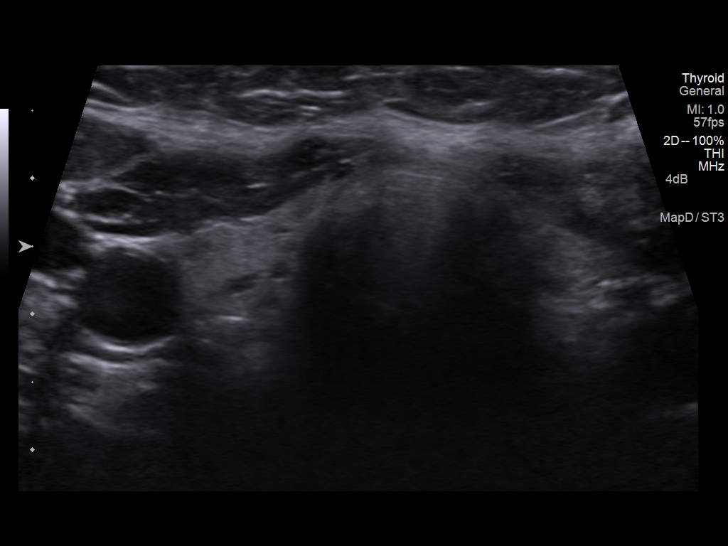

[14 of 25 positions shown; findings below may reference images not displayed]

FINDINGS: Right thyroid lobe

Measurements: 39 x 12 x 12 mm.  No nodules visualized.

Left thyroid lobe

Measurements: 41 x 10 x 13 mm. Fairly homogeneous background
echotexture with a single 5 x 3 x 6 mm mid lobe nodule.

Isthmus

Thickness: 1.5 mm.  No nodules visualized.

Lymphadenopathy

None visualized.
IMPRESSION: 1. Normal-sized thyroid with a single 6 mm left nodule. Findings do
not meet current consensus criteria for biopsy. Follow-up by
clinical exam is recommended. If patient has known risk factors for
thyroid carcinoma, consider follow-up ultrasound in 12 months. If
patient is clinically hyperthyroid, consider nuclear medicine
thyroid uptake and scan. This recommendation follows the consensus
statement: Management of Thyroid Nodules Detected as US: Society of
Radiologists in Ultrasound Consensus Conference Statement. Radiology

## 2015-08-05 ENCOUNTER — Telehealth: Payer: Self-pay | Admitting: Family Medicine

## 2015-08-05 NOTE — Telephone Encounter (Signed)
Topical antibiotic , and ho[pefully it will burst spontaneously, if not an very panful will need to go to urgent care

## 2015-08-05 NOTE — Telephone Encounter (Signed)
Laurie Chen left a message on voicemail that she has a boil type bump on her stomach that is real red and swollen and hurts, she is asking for advice as to what to do, Please advise?

## 2015-08-05 NOTE — Telephone Encounter (Signed)
Message left for patient on personal voicemail  

## 2015-08-06 ENCOUNTER — Emergency Department (HOSPITAL_COMMUNITY)
Admission: EM | Admit: 2015-08-06 | Discharge: 2015-08-06 | Disposition: A | Discharge status: Home / Self Care | Payer: BLUE CROSS/BLUE SHIELD | Attending: Emergency Medicine | Admitting: Emergency Medicine

## 2015-08-06 ENCOUNTER — Emergency Department (HOSPITAL_COMMUNITY): Payer: BLUE CROSS/BLUE SHIELD

## 2015-08-06 ENCOUNTER — Encounter (HOSPITAL_COMMUNITY): Payer: Self-pay | Admitting: Emergency Medicine

## 2015-08-06 DIAGNOSIS — L02211 Cutaneous abscess of abdominal wall: Secondary | ICD-10-CM | POA: Diagnosis present

## 2015-08-06 DIAGNOSIS — Z79899 Other long term (current) drug therapy: Secondary | ICD-10-CM | POA: Diagnosis not present

## 2015-08-06 LAB — CBC WITH DIFFERENTIAL/PLATELET
Basophils Absolute: 0 10*3/uL (ref 0.0–0.1)
Basophils Relative: 0 %
EOS ABS: 0 10*3/uL (ref 0.0–0.7)
EOS PCT: 0 %
HCT: 34.3 % — ABNORMAL LOW (ref 36.0–46.0)
HEMOGLOBIN: 10.8 g/dL — AB (ref 12.0–15.0)
Lymphocytes Relative: 15 %
Lymphs Abs: 2.8 10*3/uL (ref 0.7–4.0)
MCH: 21.8 pg — AB (ref 26.0–34.0)
MCHC: 31.5 g/dL (ref 30.0–36.0)
MCV: 69.3 fL — ABNORMAL LOW (ref 78.0–100.0)
Monocytes Absolute: 1.3 10*3/uL — ABNORMAL HIGH (ref 0.1–1.0)
Monocytes Relative: 7 %
Neutro Abs: 14.6 10*3/uL — ABNORMAL HIGH (ref 1.7–7.7)
Neutrophils Relative %: 78 %
Platelets: 334 10*3/uL (ref 150–400)
RBC: 4.95 MIL/uL (ref 3.87–5.11)
RDW: 14.7 % (ref 11.5–15.5)
WBC: 18.7 10*3/uL — AB (ref 4.0–10.5)

## 2015-08-06 LAB — BASIC METABOLIC PANEL
Anion gap: 6 (ref 5–15)
BUN: 8 mg/dL (ref 6–20)
CO2: 26 mmol/L (ref 22–32)
Calcium: 8.6 mg/dL — ABNORMAL LOW (ref 8.9–10.3)
Chloride: 104 mmol/L (ref 101–111)
Creatinine, Ser: 0.71 mg/dL (ref 0.44–1.00)
GFR calc Af Amer: >60 mL/min (ref >60)
GFR calc non Af Amer: >60 mL/min (ref >60)
Glucose, Bld: 100 mg/dL — ABNORMAL HIGH (ref 65–99)
Potassium: 3.2 mmol/L — ABNORMAL LOW (ref 3.5–5.1)
Sodium: 136 mmol/L (ref 135–145)

## 2015-08-06 MED ORDER — HYDROCODONE-ACETAMINOPHEN 5-325 MG PO TABS
2.0000 | ORAL_TABLET | Freq: Once | ORAL | Status: AC
  Administered 2015-08-06: 2 via ORAL
  Filled 2015-08-06: qty 2

## 2015-08-06 MED ORDER — VANCOMYCIN HCL IN DEXTROSE 1-5 GM/200ML-% IV SOLN
1000.0000 mg | Freq: Once | INTRAVENOUS | Status: AC
  Administered 2015-08-06: 1000 mg via INTRAVENOUS
  Filled 2015-08-06: qty 200

## 2015-08-06 MED ORDER — IBUPROFEN 800 MG PO TABS
800.0000 mg | ORAL_TABLET | Freq: Once | ORAL | Status: AC
  Administered 2015-08-06: 800 mg via ORAL
  Filled 2015-08-06: qty 1

## 2015-08-06 MED ORDER — DOXYCYCLINE HYCLATE 100 MG PO CAPS: 100.0000 mg | ORAL_CAPSULE | Freq: Two times a day (BID) | ORAL | Status: DC

## 2015-08-06 MED ORDER — BUPIVACAINE HCL (PF) 0.25 % IJ SOLN
20.0000 mL | Freq: Once | INTRAMUSCULAR | Status: AC
  Administered 2015-08-06: 20 mL
  Filled 2015-08-06: qty 30

## 2015-08-06 MED ORDER — HYDROCODONE-ACETAMINOPHEN 7.5-325 MG PO TABS: 1.0000 | ORAL_TABLET | ORAL | Status: DC | PRN

## 2015-08-06 MED ORDER — ONDANSETRON HCL 4 MG PO TABS
4.0000 mg | ORAL_TABLET | Freq: Once | ORAL | Status: AC
  Administered 2015-08-06: 4 mg via ORAL
  Filled 2015-08-06: qty 1

## 2015-08-06 MED ORDER — HYDROMORPHONE HCL 1 MG/ML IJ SOLN
1.0000 mg | Freq: Once | INTRAMUSCULAR | Status: AC
  Administered 2015-08-06: 1 mg via INTRAVENOUS
  Filled 2015-08-06: qty 1

## 2015-08-06 MED ORDER — MELOXICAM 15 MG PO TABS: 15.0000 mg | ORAL_TABLET | Freq: Every day | ORAL | Status: DC

## 2015-08-06 MED ORDER — ONDANSETRON HCL 4 MG/2ML IJ SOLN
4.0000 mg | Freq: Once | INTRAMUSCULAR | Status: AC
  Administered 2015-08-06: 4 mg via INTRAVENOUS
  Filled 2015-08-06: qty 2

## 2015-08-06 NOTE — ED Notes (Signed)
PA at bedside for I & D.

## 2015-08-06 NOTE — Discharge Instructions (Signed)
Please change the dressing to your wound daily until healed. Use doxycycline and meloxicam daily with food. May use Norco for pain. This medication may cause drowsiness. Please do not drive, drink alcohol, operate machinery, or participate in activities requiring concentration when taking this medication. Please see your primary physician, or return to the emergency department if any signs of advancing infection. Abscess An abscess is an infected area that contains a collection of pus and debris.It can occur in almost any part of the body. An abscess is also known as a furuncle or boil. CAUSES  An abscess occurs when tissue gets infected. This can occur from blockage of oil or sweat glands, infection of hair follicles, or a minor injury to the skin. As the body tries to fight the infection, pus collects in the area and creates pressure under the skin. This pressure causes pain. People with weakened immune systems have difficulty fighting infections and get certain abscesses more often.  SYMPTOMS Usually an abscess develops on the skin and becomes a painful mass that is red, warm, and tender. If the abscess forms under the skin, you may feel a moveable soft area under the skin. Some abscesses break open (rupture) on their own, but most will continue to get worse without care. The infection can spread deeper into the body and eventually into the bloodstream, causing you to feel ill.  DIAGNOSIS  Your caregiver will take your medical history and perform a physical exam. A sample of fluid may also be taken from the abscess to determine what is causing your infection. TREATMENT  Your caregiver may prescribe antibiotic medicines to fight the infection. However, taking antibiotics alone usually does not cure an abscess. Your caregiver may need to make a small cut (incision) in the abscess to drain the pus. In some cases, gauze is packed into the abscess to reduce pain and to continue draining the area. HOME CARE  INSTRUCTIONS   Only take over-the-counter or prescription medicines for pain, discomfort, or fever as directed by your caregiver.  If you were prescribed antibiotics, take them as directed. Finish them even if you start to feel better.  If gauze is used, follow your caregiver's directions for changing the gauze.  To avoid spreading the infection:  Keep your draining abscess covered with a bandage.  Wash your hands well.  Do not share personal care items, towels, or whirlpools with others.  Avoid skin contact with others.  Keep your skin and clothes clean around the abscess.  Keep all follow-up appointments as directed by your caregiver. SEEK MEDICAL CARE IF:   You have increased pain, swelling, redness, fluid drainage, or bleeding.  You have muscle aches, chills, or a general ill feeling.  You have a fever. MAKE SURE YOU:   Understand these instructions.  Will watch your condition.  Will get help right away if you are not doing well or get worse.   This information is not intended to replace advice given to you by your health care provider. Make sure you discuss any questions you have with your health care provider.   Document Released: 11/01/2004 Document Revised: 07/24/2011 Document Reviewed: 04/06/2011 [employer] Interactive Patient Education [employer] Review

## 2015-08-06 NOTE — ED Provider Notes (Signed)
CSN: [id number]     Arrival date & time 08/06/15  [id number] History   First MD Initiated Contact with Patient 08/06/15 1019     Chief Complaint  Patient presents with  . Abscess     (Consider location/radiation/quality/duration/timing/severity/associated sxs/prior Treatment) Patient is a 30 y.o. female presenting with abscess. The history is provided by the patient.  Abscess Location:  Torso Torso abscess location:  Abd LLQ Abscess quality: painful, redness and warmth   Abscess quality: not draining   Duration:  3 days Progression:  Worsening Pain details:    Quality:  Throbbing and shooting   Severity:  Moderate   Duration:  3 days   Timing:  Intermittent   Progression:  Worsening Chronicity:  New Context: insect bite/sting and skin injury   Context: not diabetes and not immunosuppression   Context comment:  Recent burn to abd. Relieved by:  Nothing Worsened by:  Draining/squeezing Associated symptoms: no fever, no nausea and no vomiting   Risk factors: no hx of MRSA     Past Medical History  Diagnosis Date  . Obesity   . Encounter for treatment of vasovagal complication due to treatment for reproductive health condition associated with oral contraception, patch, or vaginal ring    History reviewed. No pertinent past surgical history. Family History  Problem Relation Age of Onset  . Hypertension Mother   . Hypertension Father    Social History  Substance Use Topics  . Smoking status: Never Smoker   . Smokeless tobacco: None  . Alcohol Use: No   OB History    No data available     Review of Systems  Constitutional: Negative for fever.  Gastrointestinal: Negative for nausea and vomiting.  Skin:       abscess  All other systems reviewed and are negative.     Allergies  Other  Home Medications   Prior to Admission medications   Medication Sig Start Date End Date Taking? Authorizing Provider  Levonorgestrel (SKYLA) 13.5 MG IUD by Intrauterine route.   Yes  Historical Provider, MD  polycarbophil (FIBERCON) 625 MG tablet Take 625 mg by mouth daily.   Yes Historical Provider, MD  Prenatal Vit-Fe Fumarate-FA (MULTIVITAMIN-PRENATAL) 27-0.8 MG TABS tablet Take 1 tablet by mouth daily at 12 noon.   Yes Historical Provider, MD  phentermine (ADIPEX-P) 37.5 MG tablet Take 1 tablet (37.5 mg total) by mouth daily before breakfast. Patient not taking: Reported on 08/06/2015 03/16/15   Kerri PerchesMargaret E Simpson, MD  Vitamin D, Ergocalciferol, (DRISDOL) 50000 units CAPS capsule Take 1 capsule (50,000 Units total) by mouth every 7 (seven) days. Patient not taking: Reported on 08/06/2015 03/17/15   Kerri PerchesMargaret E Simpson, MD   BP 109/66 mmHg  Pulse 65  Temp(Src) 98.6 F (37 C) (Oral)  Resp 18  Ht 5\' 6"  (1.676 m)  Wt 113.399 kg  BMI 40.37 kg/m2  SpO2 100% Physical Exam  Constitutional: She is oriented to person, place, and time. She appears well-developed and well-nourished.  Non-toxic appearance.  HENT:  Head: Normocephalic.  Right Ear: Tympanic membrane and external ear normal.  Left Ear: Tympanic membrane and external ear normal.  Eyes: EOM and lids are normal. Pupils are equal, round, and reactive to light.  Neck: Normal range of motion. Neck supple. Carotid bruit is not present.  Cardiovascular: Normal rate, regular rhythm, normal heart sounds, intact distal pulses and normal pulses.   Pulmonary/Chest: Breath sounds normal. No respiratory distress.  Abdominal: Soft. Bowel sounds are normal. There is tenderness  in the left lower quadrant. There is no guarding.    Musculoskeletal: Normal range of motion.  Lymphadenopathy:       Head (right side): No submandibular adenopathy present.       Head (left side): No submandibular adenopathy present.    She has no cervical adenopathy.  Neurological: She is alert and oriented to person, place, and time. She has normal strength. No cranial nerve deficit or sensory deficit.  Skin: Skin is warm and dry.  Psychiatric: She has a  normal mood and affect. Her speech is normal.  Nursing note and vitals reviewed.   ED Course  .Marland Kitchen.Incision and Drainage Date/Time: 08/06/2015 2:19 PM Performed by: Ivery QualeBRYANT, Christyna Letendre Authorized by: Ivery QualeBRYANT, Jessabelle Markiewicz Consent: Verbal consent obtained. Risks and benefits: risks, benefits and alternatives were discussed Consent given by: patient Patient understanding: patient states understanding of the procedure being performed Patient identity confirmed: arm band Time out: Immediately prior to procedure a "time out" was called to verify the correct patient, procedure, equipment, support staff and site/side marked as required. Type: abscess Body area: trunk (abd. wall) Location details: abdomen Anesthesia: local infiltration Local anesthetic: bupivacaine 0.25% without epinephrine Anesthetic total: 15 ml Patient sedated: no Scalpel size: 11 Incision type: single straight Incision depth: subcutaneous Complexity: simple Drainage: purulent Drainage amount: moderate Wound treatment: wound left open Patient tolerance: Patient tolerated the procedure well with no immediate complications   (including critical care time) Labs Review Labs Reviewed  CBC WITH DIFFERENTIAL/PLATELET - Abnormal; Notable for the following:    WBC 18.7 (*)    Hemoglobin 10.8 (*)    HCT 34.3 (*)    MCV 69.3 (*)    MCH 21.8 (*)    Neutro Abs 14.6 (*)    Monocytes Absolute 1.3 (*)    All other components within normal limits  BASIC METABOLIC PANEL - Abnormal; Notable for the following:    Potassium 3.2 (*)    Glucose, Bld 100 (*)    Calcium 8.6 (*)    All other components within normal limits  AEROBIC CULTURE (SUPERFICIAL SPECIMEN)    Imaging Review [place] Abdomen Limited  08/06/2015  CLINICAL DATA:  Worsening left abdominal wall pain and erythema and swelling at site of insect bite for 4 days. EXAM: LIMITED ULTRASOUND OF ABDOMINAL SOFT TISSUES TECHNIQUE: Ultrasound examination of the abdominal wall soft tissues was  performed in the area of clinical concern in the left abdominal wall soft tissues. COMPARISON:  None. FINDINGS: Targeted ultrasound examination in the area of clinical concern shows an irregular complex hypoechoic fluid collection which measures 2.3 x 1.4 x 1.3 cm within the abdominal wall soft tissues. There is edema of the adjacent abdominal wall soft tissues. IMPRESSION: 2.3 cm irregular complex fluid collection in left abdominal wall soft tissues with adjacent soft tissue edema. This is consistent with abscess in the appropriate clinical setting. Electronically Signed   By: Myles RosenthalJohn  Stahl M.D.   On: 08/06/2015 12:01   I have personally reviewed and evaluated these images and lab results as part of my medical decision-making.   EKG Interpretation None      MDM  Ultrasound of the abdominal wall on reveals a fluid collection of 2.3 x 1.4. This was felt to be consistent with an abscess. The complete blood count count was elevated with the white blood cells being 18,700. There was no noted shift to the left. The basic metabolic panel showed a potassium be slightly low at 3.2.  Incision and drainage carried out. Patient will  be placed on doxycycline, Motrin, and Norco. The patient is to return if signs of advancing infection.    Final diagnoses:  Abscess of abdominal wall    **I have reviewed nursing notes, vital signs, and all appropriate lab and imaging results for this patient.Ivery Quale, PA-C 08/07/15 1351  Benjiman Core, MD 08/07/15 [phone]

## 2015-08-06 NOTE — ED Notes (Signed)
Pt states she has had an insect bite/abscess to stomach since Wed.

## 2015-08-06 NOTE — ED Notes (Signed)
US at bedside

## 2015-08-10 ENCOUNTER — Telehealth: Payer: Self-pay | Admitting: Family Medicine

## 2015-08-10 LAB — AEROBIC CULTURE W GRAM STAIN (SUPERFICIAL SPECIMEN): Special Requests: NORMAL

## 2015-08-10 MED ORDER — FLUCONAZOLE 150 MG PO TABS: ORAL_TABLET | ORAL | Status: DC

## 2015-08-10 NOTE — Telephone Encounter (Signed)
Laurie Chen is calling stating that she was in [facility] ER on 08/06/15 in regards to the abscess she had on her stomach and she was put on antibiotics and now has developed a yeast infection from it, she is asking if Dr. Lodema HongSimpson would call her in some Diflucan to [facility] [place], Please advise?

## 2015-08-10 NOTE — Telephone Encounter (Signed)
Called and left message for patient notifying that rx was sent with instructions on use.

## 2015-08-10 NOTE — Telephone Encounter (Signed)
Script sent for 3 , pls let her know , one only  cures, but if on extended days of med and she is symprtomatic may take extra tablet

## 2015-08-10 NOTE — Telephone Encounter (Signed)
Please advise.  Is it quantity #2?

## 2015-08-11 ENCOUNTER — Telehealth: Payer: Self-pay | Admitting: *Deleted

## 2015-08-11 NOTE — ED Notes (Signed)
Post ED Visit - Positive Culture Follow-up  Culture report reviewed by antimicrobial stewardship pharmacist:  []  Enzo BiNathan Batchelder, Pharm.D. []  Celedonio MiyamotoJeremy Frens, Pharm.D., BCPS [x]  Garvin FilaMike Maccia, Pharm.D. []  Georgina PillionElizabeth Martin, [address]Ph[address]D., BCPS []  KingstonMinh Pham, [place]arm.D., BCPS, AAHIVP []  Estella HuskMichelle Turner, Pharm.D., BCPS, AAHIVP []  Tennis Mustassie Stewart, Pharm.D. []  Sherle Poeob Vincent, [address]Pharm[address]D.  Positive urine culture Treated with Doxycycline Hyclate, organism sensitive to the same and no further patient follow-up is required at this time.  Virl AxeRobertson, Irene Mitcham [facility] 08/11/2015, 10:28 AM

## 2015-10-04 ENCOUNTER — Ambulatory Visit (INDEPENDENT_AMBULATORY_CARE_PROVIDER_SITE_OTHER): Payer: BLUE CROSS/BLUE SHIELD | Admitting: Family Medicine

## 2015-10-04 ENCOUNTER — Encounter: Payer: Self-pay | Admitting: Family Medicine

## 2015-10-04 VITALS — BP 118/74 | HR 78 | Temp 98.8°F | Resp 16 | Ht 67.0 in | Wt 295.0 lb

## 2015-10-04 DIAGNOSIS — J0100 Acute maxillary sinusitis, unspecified: Secondary | ICD-10-CM

## 2015-10-04 DIAGNOSIS — J020 Streptococcal pharyngitis: Secondary | ICD-10-CM | POA: Diagnosis not present

## 2015-10-04 DIAGNOSIS — R946 Abnormal results of thyroid function studies: Secondary | ICD-10-CM

## 2015-10-04 DIAGNOSIS — R7302 Impaired glucose tolerance (oral): Secondary | ICD-10-CM

## 2015-10-04 DIAGNOSIS — E559 Vitamin D deficiency, unspecified: Secondary | ICD-10-CM | POA: Diagnosis not present

## 2015-10-04 DIAGNOSIS — J3089 Other allergic rhinitis: Secondary | ICD-10-CM

## 2015-10-04 DIAGNOSIS — D649 Anemia, unspecified: Secondary | ICD-10-CM | POA: Diagnosis not present

## 2015-10-04 LAB — CBC WITH DIFFERENTIAL/PLATELET
Basophils Absolute: 0 cells/uL (ref 0–200)
Basophils Relative: 0 %
EOS ABS: 278 {cells}/uL (ref 15–500)
Eosinophils Relative: 2 %
HEMATOCRIT: 38.8 % (ref 35.0–45.0)
Hemoglobin: 11.8 g/dL (ref 11.7–15.5)
LYMPHS PCT: 19 %
Lymphs Abs: 2641 cells/uL (ref 850–3900)
MCH: 21.5 pg — ABNORMAL LOW (ref 27.0–33.0)
MCHC: 30.4 g/dL — AB (ref 32.0–36.0)
MCV: 70.5 fL — AB (ref 80.0–100.0)
MONO ABS: 834 {cells}/uL (ref 200–950)
MPV: 9.7 fL (ref 7.5–12.5)
Monocytes Relative: 6 %
NEUTROS PCT: 73 %
Neutro Abs: 10147 cells/uL — ABNORMAL HIGH (ref 1500–7800)
Platelets: 417 10*3/uL — ABNORMAL HIGH (ref 140–400)
RBC: 5.5 MIL/uL — AB (ref 3.80–5.10)
RDW: 15.4 % — AB (ref 11.0–15.0)
WBC: 13.9 10*3/uL — AB (ref 3.8–10.8)

## 2015-10-04 LAB — TSH: TSH: 1.08 m[IU]/L

## 2015-10-04 LAB — POCT RAPID STREP A (OFFICE): RAPID STREP A SCREEN: POSITIVE — AB

## 2015-10-04 MED ORDER — PENICILLIN V POTASSIUM 500 MG PO TABS: 500.0000 mg | ORAL_TABLET | Freq: Three times a day (TID) | ORAL | 0 refills | Status: AC

## 2015-10-04 MED ORDER — CEFTRIAXONE SODIUM 1 G IJ SOLR
500.0000 mg | Freq: Once | INTRAMUSCULAR | Status: AC
  Administered 2015-10-04: 500 mg via INTRAMUSCULAR

## 2015-10-04 MED ORDER — PREDNISONE 5 MG (21) PO TBPK
5.0000 mg | ORAL_TABLET | ORAL | 0 refills | Status: AC
Start: 2015-10-04 — End: ?

## 2015-10-04 MED ORDER — FLUCONAZOLE 150 MG PO TABS: ORAL_TABLET | ORAL | 0 refills | Status: AC

## 2015-10-04 MED ORDER — PHENTERMINE HCL 37.5 MG PO TABS: 37.5000 mg | ORAL_TABLET | Freq: Every day | ORAL | 1 refills | Status: AC

## 2015-10-04 NOTE — Patient Instructions (Addendum)
F/u in  4 months, call if you need me sooner  You are treated for acute sinusitis and strep throat Antibiotic and prednisone dose pack prescribed, Rocephin administered in office Take sudafed one daily for next 3 to 5 days to reduce sinus congestion Work excuse from today return on 10/06/2015   Fasting labs oVERDUE, please get today  HALF phentermine once every morning with breakfast, NEED to change eating habits and commit to 30 mins  Every day of exercise  Weight loss goal of 12 to 15 pounds in 4 months, you CAN do this! Strep Throat Strep throat is an infection of the throat. It is caused by germs. Strep throat spreads from person to person because of coughing, sneezing, or close contact. HOME CARE Medicines  Take over-the-counter and prescription medicines only as told by your doctor.  Take your antibiotic medicine as told by your doctor. Do not stop taking the medicine even if you feel better.  Have family members who also have a sore throat or fever go to a doctor. Eating and Drinking  Do not share food, drinking cups, or personal items.  Try eating soft foods until your sore throat feels better.  Drink enough fluid to keep your pee (urine) clear or pale yellow. General Instructions  Rinse your mouth (gargle) with a salt-water mixture 3-4 times per day or as needed. To make a salt-water mixture, stir -1 tsp of salt into 1 cup of warm water.  Make sure that all people in your house wash their hands well.  Rest.  Stay home from school or work until you have been taking antibiotics for 24 hours.  Keep all follow-up visits as told by your doctor. This is important. GET HELP IF:  Your neck keeps getting bigger.  You get a rash, cough, or earache.  You cough up thick liquid that is green, yellow-brown, or bloody.  You have pain that does not get better with medicine.  Your problems get worse instead of getting better.  You have a fever. GET HELP RIGHT AWAY  IF:  You throw up (vomit).  You get a very bad headache.  You neck hurts or it feels stiff.  You have chest pain or you are short of breath.  You have drooling, very bad throat pain, or changes in your voice.  Your neck is swollen or the skin gets red and tender.  Your mouth is dry or you are peeing less than normal.  You keep feeling more tired or it is hard to wake up.  Your joints are red or they hurt.   This information is not intended to replace advice given to you by your health care provider. Make sure you discuss any questions you have with your health care provider.   Document Released: 07/11/2007 Document Revised: 10/13/2014 Document Reviewed: 05/17/2014 Elsevier Interactive Patient Education [employer]2016 Elsevier Inc.

## 2015-10-04 NOTE — Assessment & Plan Note (Signed)
Deteriorated. Patient re-educated about  the importance of commitment to a  minimum of 150 minutes of exercise per week.  The importance of healthy food choices with portion control discussed. Encouraged to start a food diary, count calories and to consider  joining a support group. Sample diet sheets offered. Goals set by the patient for the next several months.   Weight /BMI 10/04/2015 08/06/2015 03/16/2015  WEIGHT 295 lb 250 lb 300 lb  HEIGHT 5\' 7"  5\' 6"  5\' 7"   BMI 46.2 kg/m2 40.37 kg/m2 46.98 kg/m2

## 2015-10-05 LAB — COMPREHENSIVE METABOLIC PANEL
ALT: 13 U/L (ref 6–29)
AST: 14 U/L (ref 10–30)
Albumin: 4.2 g/dL (ref 3.6–5.1)
Alkaline Phosphatase: 96 U/L (ref 33–115)
BUN: 8 mg/dL (ref 7–25)
CHLORIDE: 102 mmol/L (ref 98–110)
CO2: 24 mmol/L (ref 20–31)
CREATININE: 0.77 mg/dL (ref 0.50–1.10)
Calcium: 9.5 mg/dL (ref 8.6–10.2)
GLUCOSE: 87 mg/dL (ref 65–99)
Potassium: 3.9 mmol/L (ref 3.5–5.3)
SODIUM: 137 mmol/L (ref 135–146)
TOTAL PROTEIN: 8 g/dL (ref 6.1–8.1)
Total Bilirubin: 0.3 mg/dL (ref 0.2–1.2)

## 2015-10-05 LAB — LIPID PANEL
CHOL/HDL RATIO: 2.8 ratio (ref <=5.0)
CHOLESTEROL: 124 mg/dL — AB (ref 125–200)
HDL: 44 mg/dL — ABNORMAL LOW (ref >=46)
LDL CALC: 60 mg/dL (ref <130)
Triglycerides: 98 mg/dL (ref <150)
VLDL: 20 mg/dL (ref <30)

## 2015-10-05 LAB — VITAMIN D 25 HYDROXY (VIT D DEFICIENCY, FRACTURES): VIT D 25 HYDROXY: 20 ng/mL — AB (ref 30–100)

## 2015-10-05 LAB — HEMOGLOBIN A1C
HEMOGLOBIN A1C: 5.3 % (ref <5.7)
MEAN PLASMA GLUCOSE: 105 mg/dL

## 2015-10-10 ENCOUNTER — Encounter: Payer: Self-pay | Admitting: Family Medicine

## 2015-10-10 NOTE — Assessment & Plan Note (Signed)
Tender over maxillary sinus with adenitis , antibiotics course prescribed

## 2015-10-10 NOTE — Assessment & Plan Note (Signed)
Symptomatic with positive rapid strep test, Rocephin given in office , penicillin prescribed for 10 days and 24 hr work excuse

## 2015-10-10 NOTE — Assessment & Plan Note (Signed)
Patient educated about the importance of limiting  Carbohydrate intake , the need to commit to daily physical activity for a minimum of 30 minutes , and to commit weight loss. The fact that changes in all these areas will reduce or eliminate all together the development of diabetes is stressed.  Improved despite weight gain which is good  Diabetic Labs Latest Ref Rng & Units 10/04/2015 08/06/2015 06/10/2014 03/27/2013 02/08/2011  HbA1c <5.7 % 5.3 - 5.8(H) 5.5 5.6  Chol 125 - 200 mg/dL 099(I124(L) - 91 [id number]  HDL >=46 mg/dL 53(Z44(L) - 76(B33(L) 41 47  Calc LDL <130 mg/dL 60 - 39 56 73  Triglycerides <150 mg/dL 98 - 96 74 68  Creatinine 0.50 - 1.10 mg/dL 3.410.77 9.370.71 9.020.71 4.090.75 7.350.73   BP/Weight 10/04/2015 08/06/2015 03/16/2015 06/10/2014 04/05/2014 09/17/2013 03/23/2013  Systolic BP 118 102 114 130 133 108 106  Diastolic BP 74 74 72 74 86 64 70  Wt. (Lbs) 295 250 300 285.04 250 264.4 254.12  BMI 46.2 40.37 46.98 44.63 40.37 41.4 41.04   No flowsheet data found.

## 2015-10-10 NOTE — Assessment & Plan Note (Signed)
Uncontrolled, short steroid course prescribed

## 2015-10-10 NOTE — Assessment & Plan Note (Signed)
Updated lab needed at/ before next visit.   

## 2015-10-10 NOTE — Progress Notes (Signed)
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 Labs are overdue and will be repeated and reviewed after visit Preventive health is updated, specifically  Cancer screening and Immunization.  Unable to give flu vaccine as ill    ROS Denies recent fever  Denies  ear pain  Denies chest congestion, productive cough or wheezing. Denies chest pains, palpitations and leg swelling Denies abdominal pain, nausea, vomiting,diarrhea or constipation.   Denies dysuria, frequency, hesitancy or incontinence. Denies joint pain, swelling and limitation in mobility. Denies headaches, seizures, numbness, or tingling. Denies depression, anxiety or insomnia. Denies skin break down or rash.   PE  BP 118/74   Pulse 78   Temp 98.8 F (37.1 C) (Oral)   Resp 16   Ht 5\' 7"  (1.702 m)   Wt 295 lb (133.8 kg)   SpO2 100%   BMI 46.20 kg/m   Patient alert and oriented and in no cardiopulmonary distress.  HEENT: No facial asymmetry, EOMI,   oropharynx erythematous and moist.  Neck supple no JVD, no mass.Maxillary sinus tenderness with right anterior cervical adenopathy TM clear bilaterally  Chest: Clear to auscultation bilaterally.  CVS: S1, S2 no murmurs, no S3.Regular rate.  ABD: Soft non tender.   Ext: No edema  MS: Adequate ROM spine, shoulders, hips and knees.  Skin: Intact, no ulcerations or rash noted.  Psych: Good eye contact, normal affect. Memory intact not anxious or depressed appearing.  CNS: CN 2-12 intact, power,  normal throughout.no focal deficits noted.   Assessment & Plan  Morbid obesity Deteriorated. Patient re-educated about  the importance of commitment to a  minimum of 150 minutes of exercise per week.  The  importance of healthy food choices with portion control discussed. Encouraged to start a food diary, count calories and to consider  joining a support group. Sample diet sheets offered. Goals set by the patient for the next several months.   Weight /BMI 10/04/2015 08/06/2015 03/16/2015  WEIGHT 295 lb 250 lb 300 lb  HEIGHT 5\' 7"  5\' 6"  5\' 7"   BMI 46.2 kg/m2 40.37 kg/m2 46.98 kg/m2      Strep pharyngitis Symptomatic with positive rapid strep test, Rocephin given in office , penicillin prescribed for 10 days and 24 hr work excuse  Acute sinusitis Tender over maxillary sinus with adenitis , antibiotics course prescribed  Thyroid function test abnormal Updated lab needed at/ before next visit.   IGT (impaired glucose tolerance) Patient educated about the importance of limiting  Carbohydrate intake , the need to commit to daily physical activity for a minimum of 30 minutes , and to commit weight loss. The fact that changes in all these areas will reduce or eliminate all together the development of diabetes is stressed.  Improved despite weight gain which is good  Diabetic Labs Latest Ref Rng & Units 10/04/2015 08/06/2015 06/10/2014 03/27/2013 02/08/2011  HbA1c <5.7 % 5.3 - 5.8(H) 5.5 5.6  Chol 125 - 200 mg/dL 295(M124(L) - 91 [id number]  HDL >=46 mg/dL 40(N44(L) - 02(V33(L) 41 47  Calc LDL <130 mg/dL 60 - 39 56 73  Triglycerides <150 mg/dL 98 - 96 74 68  Creatinine 0.50 - 1.10 mg/dL 2.530.77 6.640.71 4.030.71  0.75 0.73   BP/Weight 10/04/2015 08/06/2015 03/16/2015 06/10/2014 04/05/2014 09/17/2013 03/23/2013  Systolic BP 118 102 114 130 133 108 106  Diastolic BP 74 74 72 74 86 64 70  Wt. (Lbs) 295 250 300 285.04 250 264.4 254.12  BMI 46.2 40.37 46.98 44.63 40.37 41.4 41.04   No flowsheet data found.    Allergic rhinitis Uncontrolled, short steroid course prescribed

## 2016-02-14 ENCOUNTER — Ambulatory Visit: Payer: BLUE CROSS/BLUE SHIELD | Admitting: Family Medicine

## 2016-07-10 IMAGING — US US SOFT TISSUE HEAD/NECK
1 series · 14 of 25 positions shown · non-contrast
Comparison: 03/27/2013

CLINICAL DATA: Thyroid nodule

EXAM:
THYROID ULTRASOUND
TECHNIQUE: Ultrasound examination of the thyroid gland and adjacent soft
tissues was performed.

[Series 1: us soft tissue head/neck · 0.07mm/px · 14 of 37 slices shown]
[im 1/37]
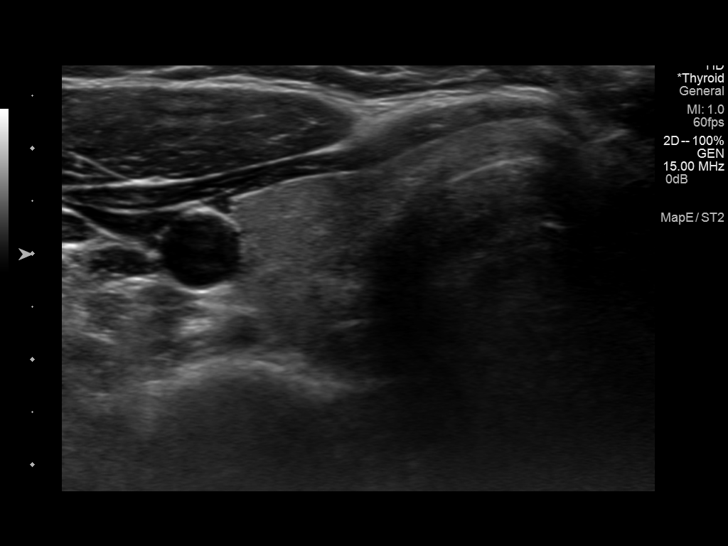
[im 4/37]
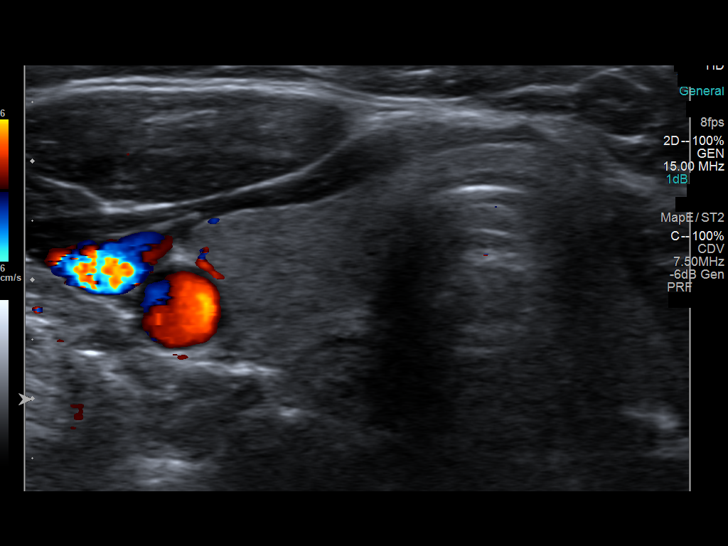
[im 7/37]
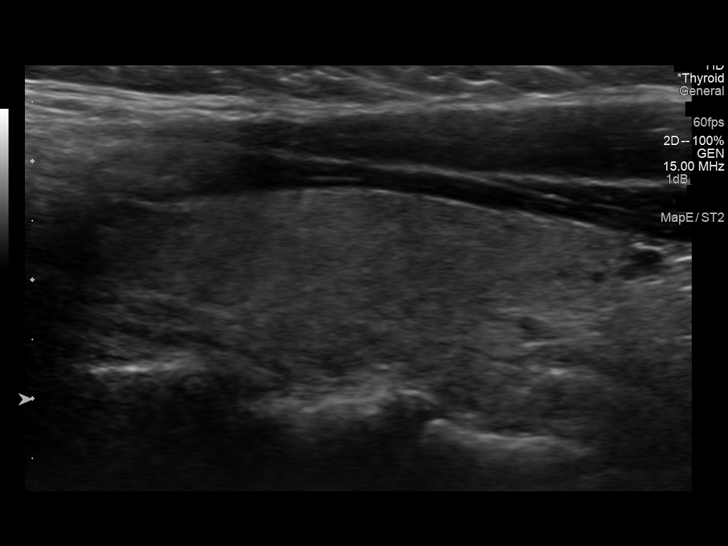
[im 10/37]
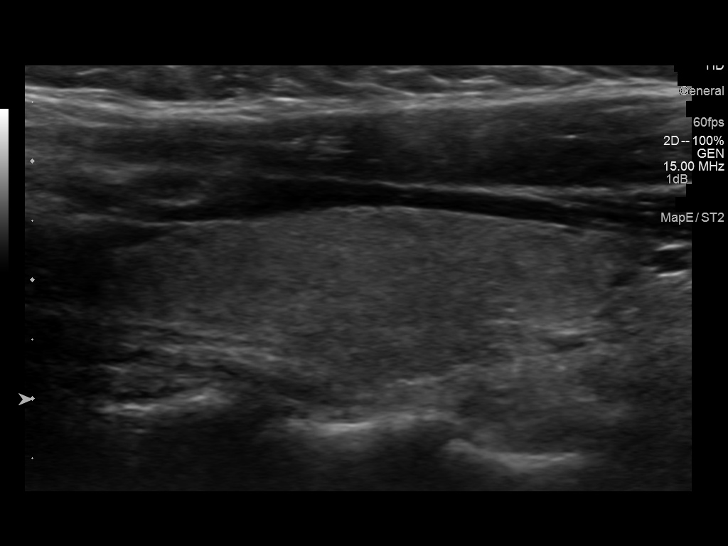
[im 13/37]
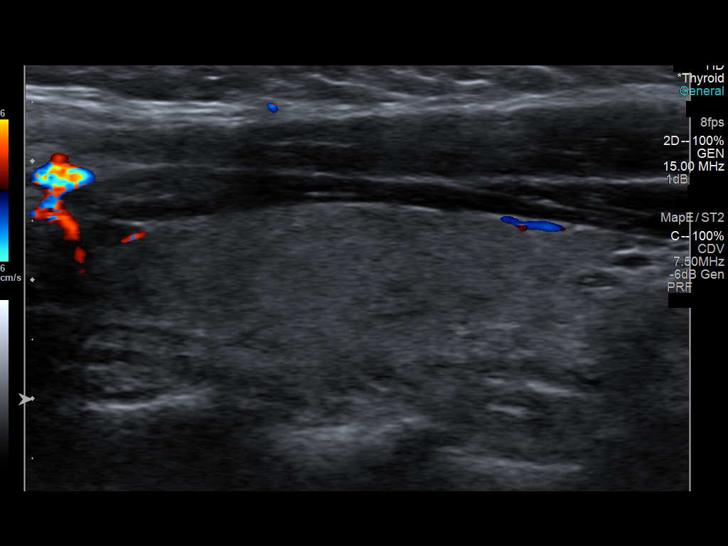
[im 14/37]
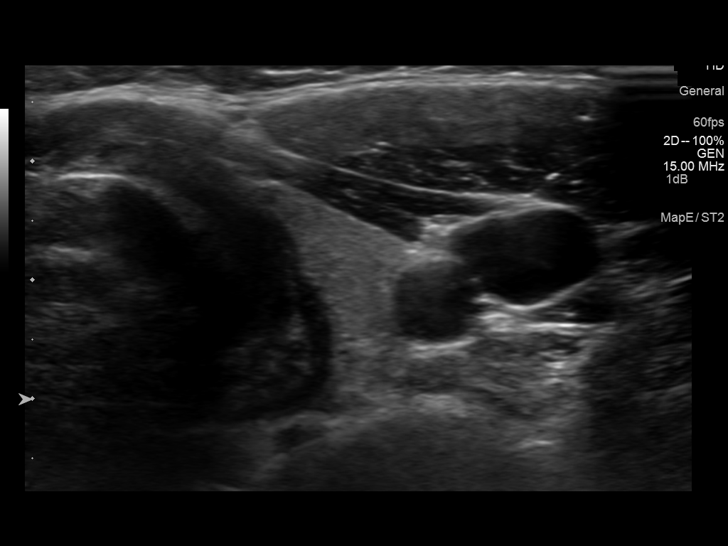
[im 17/37]
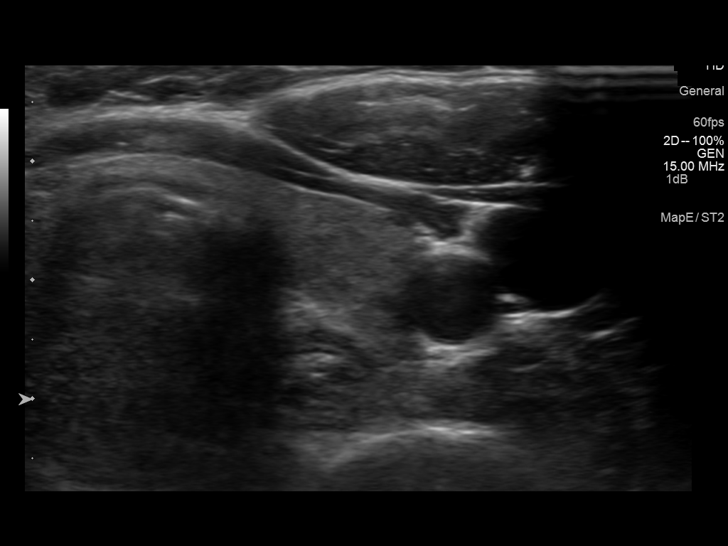
[im 20/37]
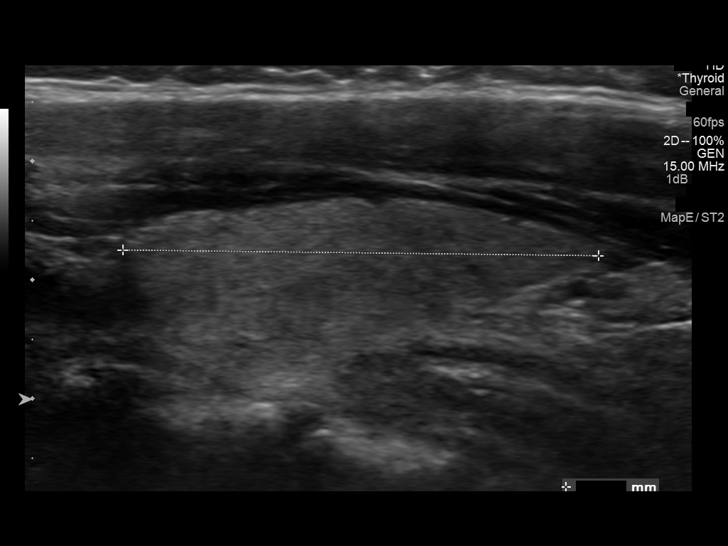
[im 23/37]
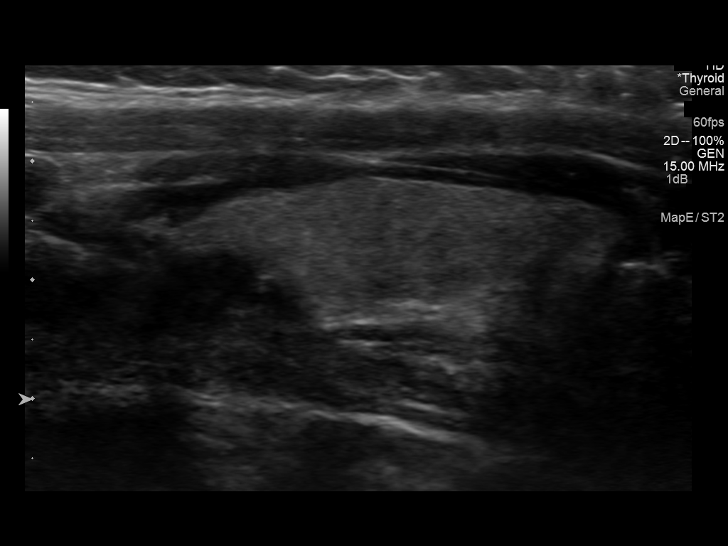
[im 25/37]
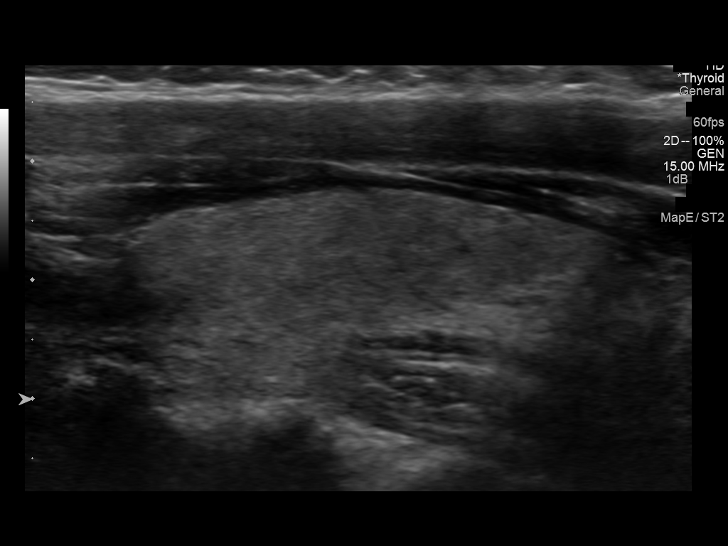
[im 28/37]
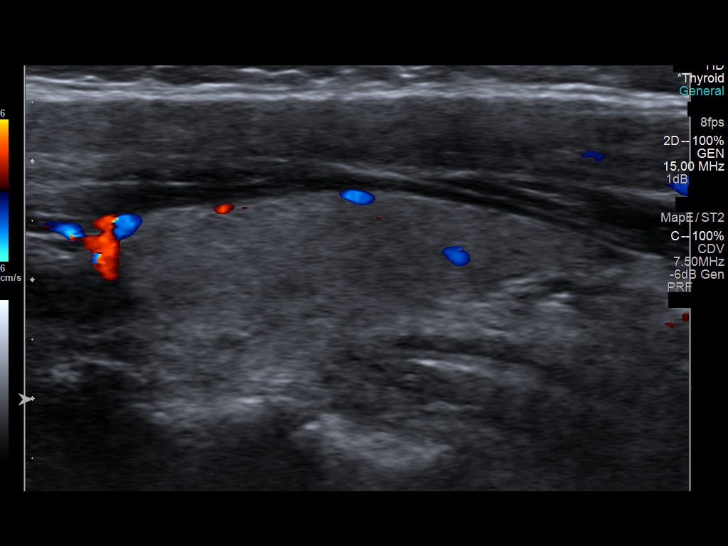
[im 31/37]
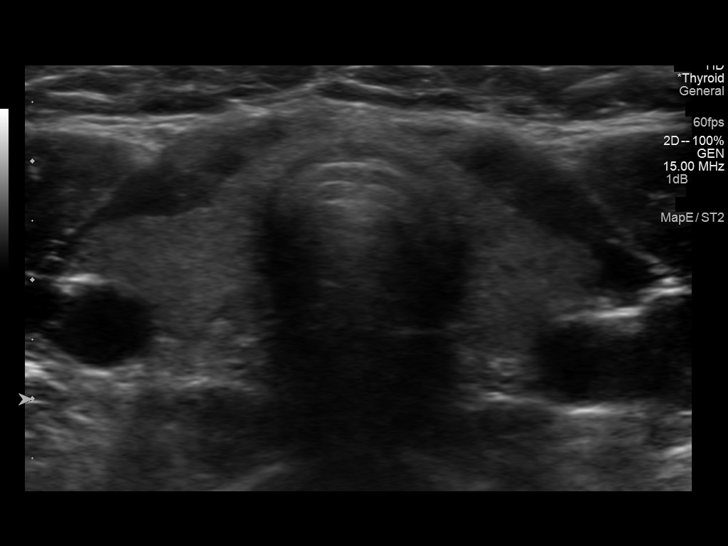
[im 34/37]
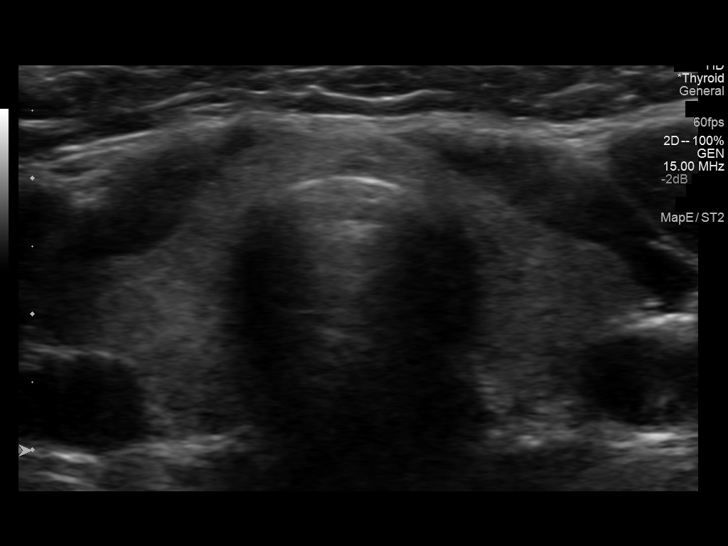
[im 37/37]
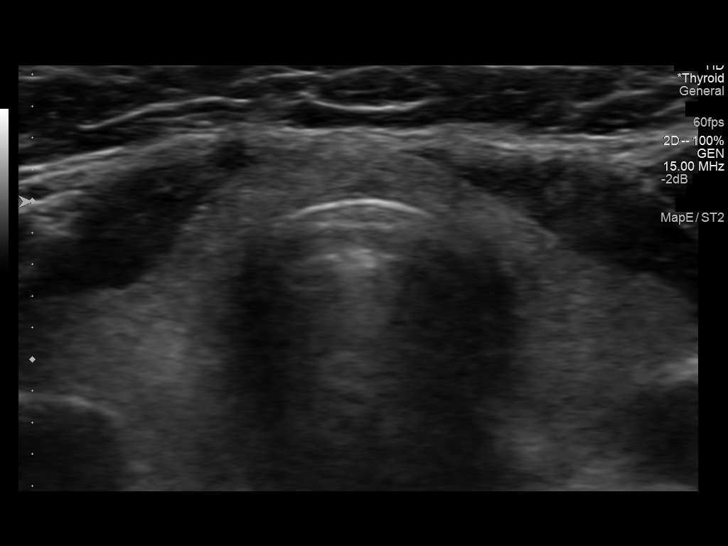

[14 of 25 positions shown; findings below may reference images not displayed]

FINDINGS: Right thyroid lobe

Measurements: 4.8 x 1.6 x 1.7 cm.  No nodules visualized.

Left thyroid lobe

Measurements: 4.0 x 1.4 x 1.4 cm. No nodules visualized. The
previously visualize left nodule was no longer seen.

Isthmus

Thickness: 3 mm.  No nodules visualized.

Lymphadenopathy

None visualized.
IMPRESSION: No evidence of nodule. The previously visualize left thyroid nodule
is no longer seen. Within normal limits.

## 2017-09-18 IMAGING — US US ABDOMEN LIMITED
1 series · 12 of 12 positions shown · non-contrast
Comparison: None.

CLINICAL DATA: Worsening left abdominal wall pain and erythema and
swelling at site of insect bite for 4 days.

EXAM:
LIMITED ULTRASOUND OF ABDOMINAL SOFT TISSUES
TECHNIQUE: Ultrasound examination of the abdominal wall soft tissues was
performed in the area of clinical concern in the left abdominal wall
soft tissues.

[Series 1: us abdomen limited · 0.06mm/px · 12 acquisitions, 12 frames shown]
[im 1/12]
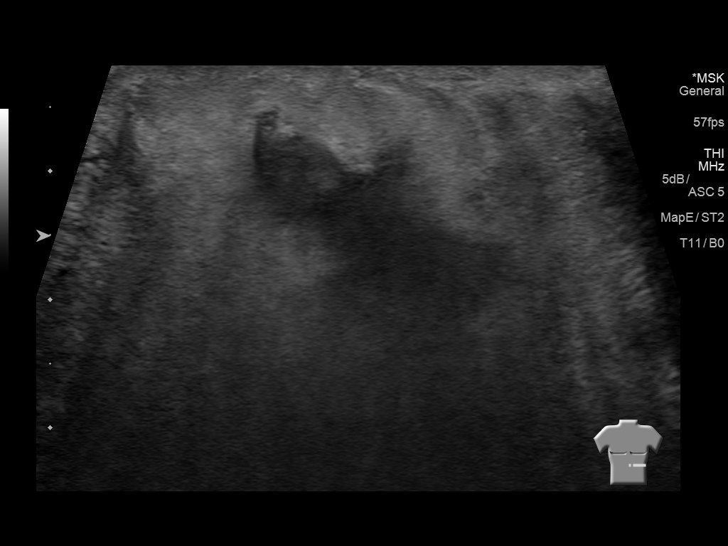
[im 2/12]
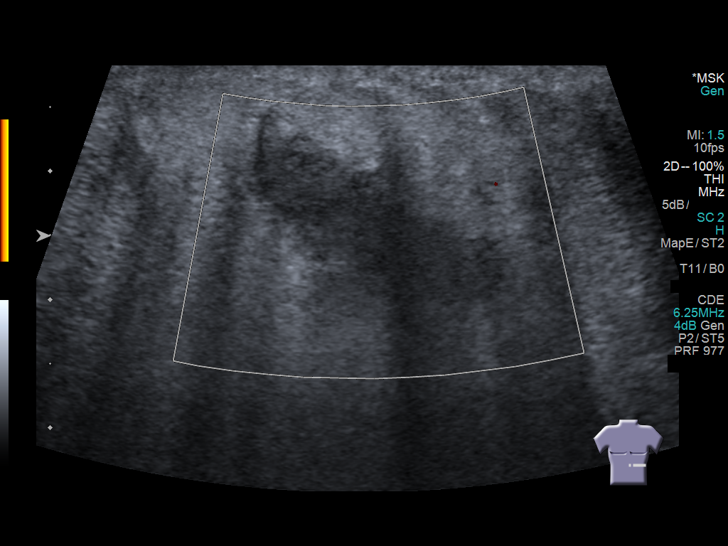
[im 3/12]
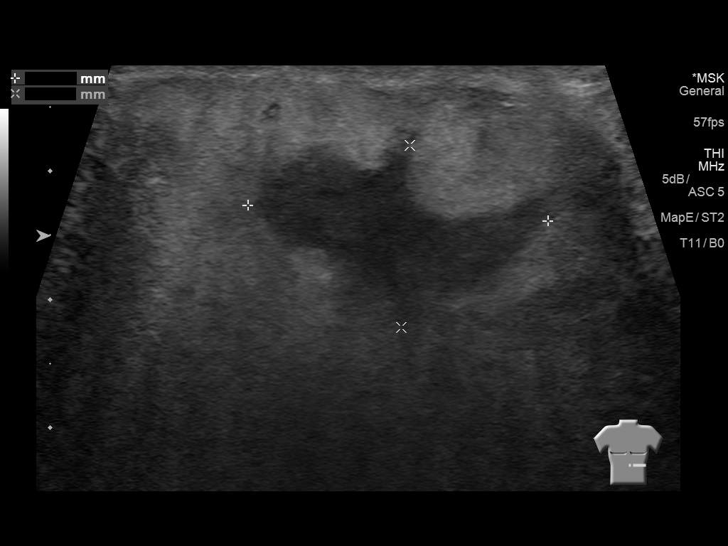
[im 4/12]
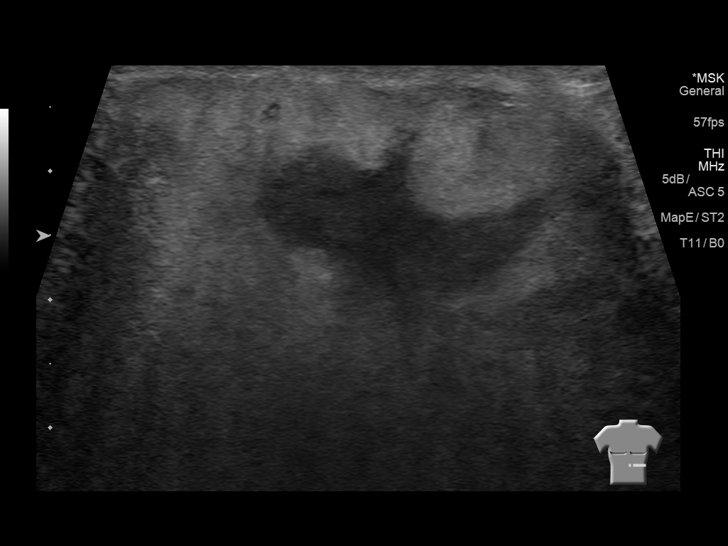
[im 5/12]
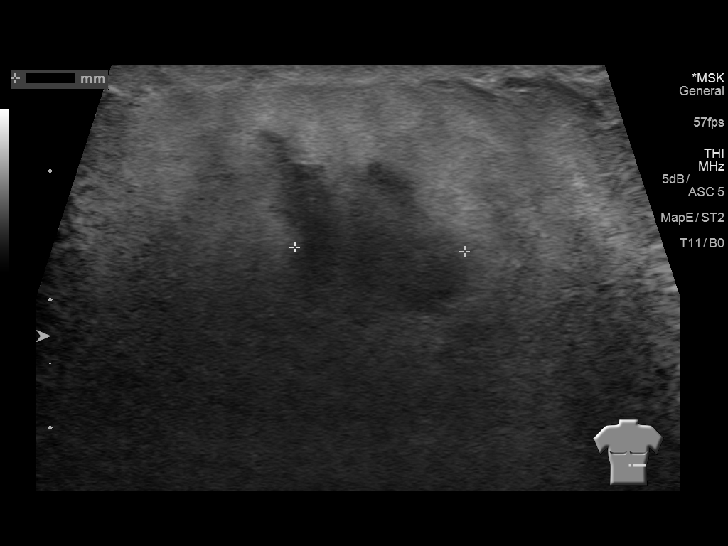
[im 6/12]
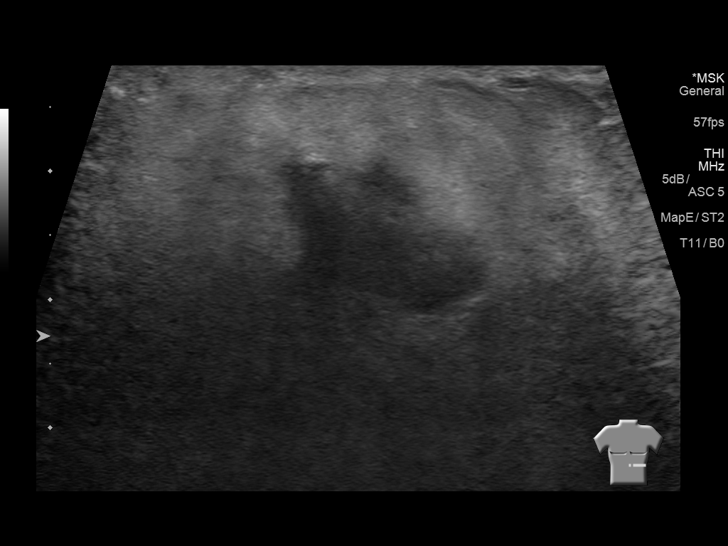
[im 7/12]
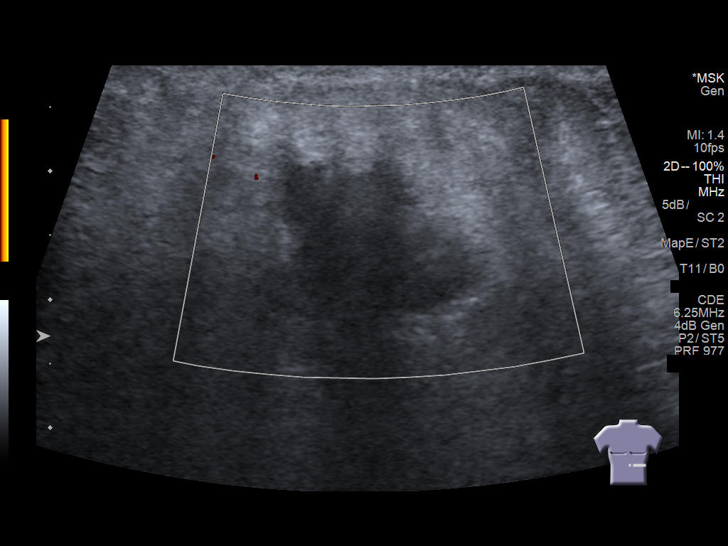
[im 8/12]
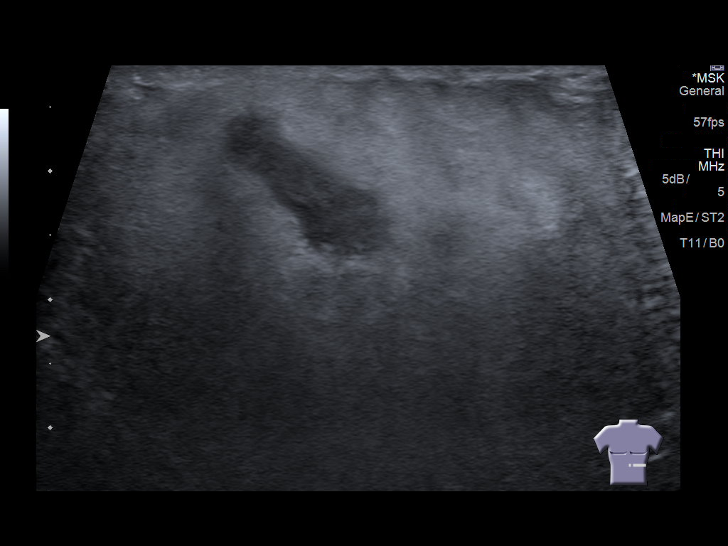
[im 9/12]
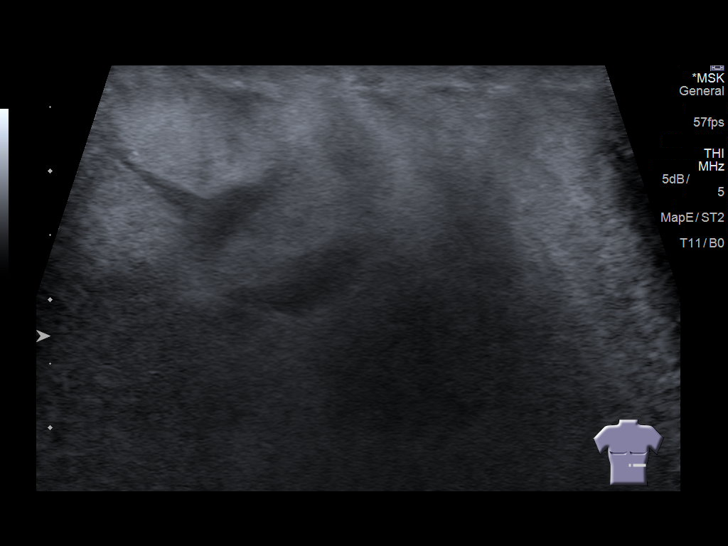
[im 10/12]
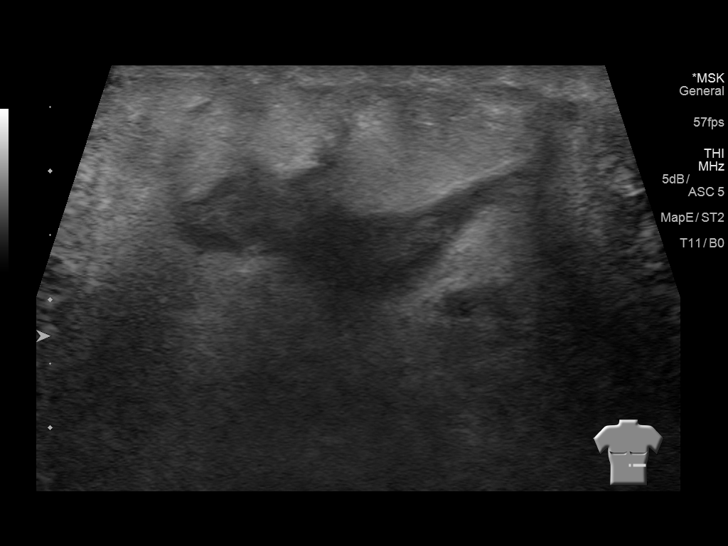
[im 11/12]
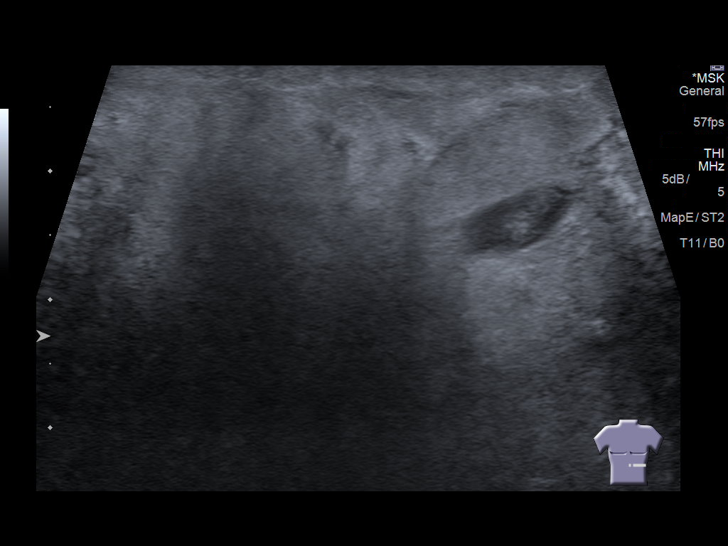
[im 12/12]
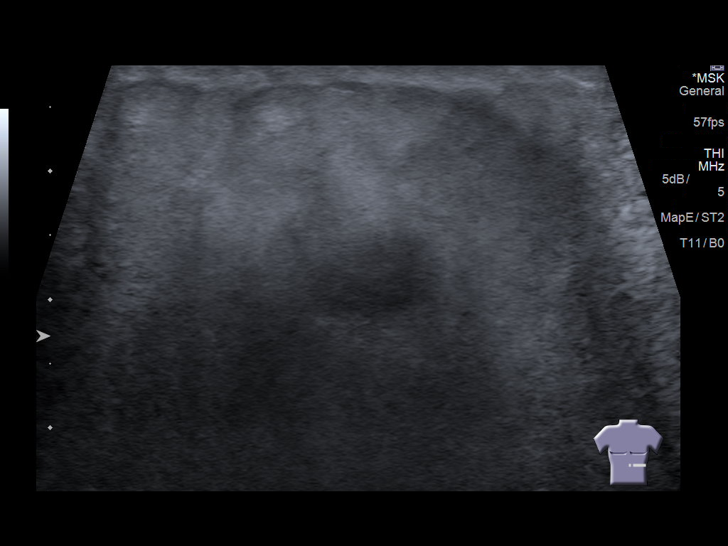

[12 of 12 positions shown; findings below may reference images not displayed]

FINDINGS: Targeted ultrasound examination in the area of clinical concern
shows an irregular complex hypoechoic fluid collection which
measures 2.3 x 1.4 x 1.3 cm within the abdominal wall soft tissues.
There is edema of the adjacent abdominal wall soft tissues.
IMPRESSION: 2.3 cm irregular complex fluid collection in left abdominal wall
soft tissues with adjacent soft tissue edema. This is consistent
with abscess in the appropriate clinical setting.

## 2021-09-12 ENCOUNTER — Ambulatory Visit (INDEPENDENT_AMBULATORY_CARE_PROVIDER_SITE_OTHER): Payer: BLUE CROSS/BLUE SHIELD | Admitting: Orthopaedic Surgery

## 2021-09-12 ENCOUNTER — Encounter: Payer: Self-pay | Admitting: Orthopaedic Surgery

## 2021-09-12 ENCOUNTER — Ambulatory Visit (INDEPENDENT_AMBULATORY_CARE_PROVIDER_SITE_OTHER): Payer: Self-pay

## 2021-09-12 DIAGNOSIS — M25562 Pain in left knee: Secondary | ICD-10-CM

## 2021-09-12 MED ORDER — TRAMADOL HCL 50 MG PO TABS: 50.0000 mg | ORAL_TABLET | Freq: Three times a day (TID) | ORAL | 2 refills | Status: AC | PRN

## 2021-09-12 NOTE — Progress Notes (Unsigned)
Office Visit Note   Patient: Laurie Chen           Date of Birth: 1985/03/27           MRN: [id number] Visit Date: 09/12/2021              Requested by: No referring provider defined for this encounter. PCP: No primary care provider on file.   Assessment & Plan: Visit Diagnoses:  1. Acute pain of left knee     Plan: Impression is left knee contusion.  We have discussed that this should improve with time, rest, ice and elevation.  I sent in tramadol to take as needed.  Have also provided her with an out of work note for the next 2 weeks per her wishes.  She will follow-up with [place] as needed.  Call with concerns or questions.  Follow-Up Instructions: Return if symptoms worsen or fail to improve.   Orders:  Orders Placed This Encounter  Procedures   XR KNEE 3 VIEW LEFT   No orders of the defined types were placed in this encounter.     Procedures: No procedures performed   Clinical Data: No additional findings.   Subjective: Chief Complaint  Patient presents with   Left Knee - Pain    HPI patient is a pleasant 36 year old female who comes in today with left knee pain.  About 2 weeks ago, she fell on concrete steps landing on the anterior aspect of her knee.  She has had pain ever since.  All of her pain is to the patella.  Symptoms worse with walking, going from seated to standing position as well as going up or down inclines such as stairs.  She has been taking ibuprofen without relief.  No previous knee pain prior to this injury.  Review of Systems as detailed in HPI.  All others reviewed and are negative.   Objective: Vital Signs: There were no vitals taken for this visit.  Physical Exam well-developed well-nourished female no acute distress.  Alert and oriented x 3.  Ortho Exam left knee exam: Moderate tenderness to the patella.  Range of motion 0 to 90 degrees.  She is able to fully extend her leg and is able to straight leg raise against gravity.  She is  neurovascular intact distally.  Specialty Comments:  No specialty comments available.  Imaging: No results found.   PMFS History: Patient Active Problem List   Diagnosis Date Noted   Strep pharyngitis 10/04/2015   IGT (impaired glucose tolerance) 03/21/2015   GERD (gastroesophageal reflux disease) 06/10/2014   Sensitivity to sunlight 09/17/2013   Psoriasis 03/23/2013   Acne vulgaris 03/23/2013   Eczema 03/23/2013   Thyroid function test abnormal 03/23/2013   Anemia 06/02/2012   Acute sinusitis 05/13/2011   Morbid obesity (HCC) 02/11/2011   Vitamin D deficiency 02/11/2011   Allergic rhinitis 01/23/2008   Past Medical History:  Diagnosis Date   Encounter for treatment of vasovagal complication due to treatment for reproductive health condition associated with oral contraception, patch, or vaginal ring    Obesity     Family History  Problem Relation Age of Onset   Hypertension Mother    Hypertension Father     No past surgical history on file. Social History   Occupational History   Occupation: Immunologist   Tobacco Use   Smoking status: Never   Smokeless tobacco: Not on file  Substance and Sexual Activity   Alcohol use: No   Drug use:  No   Sexual activity: Not on file

## 2021-09-18 ENCOUNTER — Encounter: Payer: Self-pay | Admitting: Orthopaedic Surgery

## 2021-09-18 ENCOUNTER — Other Ambulatory Visit: Payer: Self-pay

## 2021-09-18 DIAGNOSIS — M25562 Pain in left knee: Secondary | ICD-10-CM

## 2021-09-18 NOTE — Telephone Encounter (Signed)
Let's get a CT scan of the knee to rule out fracture please.  Thank you.

## 2021-09-21 NOTE — Telephone Encounter (Signed)
That is fine 

## 2021-10-05 ENCOUNTER — Ambulatory Visit
Admission: RE | Admit: 2021-10-05 | Discharge: 2021-10-05 | Disposition: A | Discharge status: Home / Self Care | Payer: No Typology Code available for payment source | Source: Ambulatory Visit | Attending: Orthopaedic Surgery | Admitting: Orthopaedic Surgery

## 2021-10-05 DIAGNOSIS — M25562 Pain in left knee: Secondary | ICD-10-CM

## 2021-10-11 ENCOUNTER — Encounter: Payer: Self-pay | Admitting: Orthopaedic Surgery

## 2021-10-11 ENCOUNTER — Telehealth: Payer: Self-pay | Admitting: Orthopaedic Surgery

## 2021-10-11 ENCOUNTER — Ambulatory Visit: Payer: No Typology Code available for payment source | Admitting: Orthopaedic Surgery

## 2021-10-11 DIAGNOSIS — G8929 Other chronic pain: Secondary | ICD-10-CM

## 2021-10-11 DIAGNOSIS — M25562 Pain in left knee: Secondary | ICD-10-CM | POA: Diagnosis not present

## 2021-10-11 NOTE — Telephone Encounter (Signed)
[employer] forms received. To [employer]. 

## 2021-10-11 NOTE — Progress Notes (Signed)
   Office Visit Note   Patient: Laurie Chen           Date of Birth: 26-May-1985           MRN: [id number] Visit Date: 10/11/2021              Requested by: No referring provider defined for this encounter. PCP: Pcp, No   Assessment & Plan: Visit Diagnoses:  1. Chronic pain of left knee     Plan: Impression is left knee soft tissue contusion.  At this point, CT scan is unremarkable for fracture.  We have discussed that the soft tissue contusion may take several months to improve.  I recommended continuation of ice, heat and elevation as needed.  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  She will follow-up with [place] as needed.  Call with concerns or questions.  Follow-Up Instructions: Return if symptoms worsen or fail to improve.   Orders:  No orders of the defined types were placed in this encounter.  No orders of the defined types were placed in this encounter.     Procedures: No procedures performed   Clinical Data: No additional findings.   Subjective: Chief Complaint  Patient presents with   Left Knee - Follow-up    CT review    HPI patient is a pleasant 36 year old female who comes in today to discuss CT results of the left knee.  She sustained a fall landing on the anterior aspect of her left knee well over a month ago.  She has had constant pain to the area worse with bearing weight.  CT scan obtained which was negative for fracture.  Patient continues to be symptomatic.  She was taking tramadol as needed.     Objective: Vital Signs: There were no vitals taken for this visit.    Ortho Exam left knee exam is unchanged  Specialty Comments:  No specialty comments available.  Imaging: No new imaging   PMFS History: Patient Active Problem List   Diagnosis Date Noted   Strep pharyngitis 10/04/2015   IGT (impaired glucose tolerance) 03/21/2015   GERD (gastroesophageal reflux disease) 06/10/2014   Sensitivity to  sunlight 09/17/2013   Psoriasis 03/23/2013   Acne vulgaris 03/23/2013   Eczema 03/23/2013   Thyroid function test abnormal 03/23/2013   Anemia 06/02/2012   Acute sinusitis 05/13/2011   Morbid obesity (HCC) 02/11/2011   Vitamin D deficiency 02/11/2011   Allergic rhinitis 01/23/2008   Past Medical History:  Diagnosis Date   Encounter for treatment of vasovagal complication due to treatment for reproductive health condition associated with oral contraception, patch, or vaginal ring    Obesity     Family History  Problem Relation Age of Onset   Hypertension Mother    Hypertension Father     History reviewed. No pertinent surgical history. Social History   Occupational History   Occupation: Immunologist   Tobacco Use   Smoking status: Never   Smokeless tobacco: Not on file  Substance and Sexual Activity   Alcohol use: No   Drug use: No   Sexual activity: Not on file

## 2021-10-16 ENCOUNTER — Encounter: Payer: Self-pay | Admitting: Orthopaedic Surgery

## 2022-03-08 ENCOUNTER — Encounter: Payer: Self-pay | Admitting: Orthopaedic Surgery

## 2022-09-10 ENCOUNTER — Other Ambulatory Visit: Payer: Self-pay | Admitting: Physician Assistant

## 2022-11-23 ENCOUNTER — Emergency Department (HOSPITAL_COMMUNITY)
Admission: EM | Admit: 2022-11-23 | Discharge: 2022-11-23 | Disposition: A | Discharge status: Home / Self Care | Payer: Commercial Managed Care - PPO | Attending: Emergency Medicine | Admitting: Emergency Medicine

## 2022-11-23 ENCOUNTER — Encounter (HOSPITAL_COMMUNITY): Payer: Self-pay

## 2022-11-23 ENCOUNTER — Emergency Department (HOSPITAL_COMMUNITY): Payer: Commercial Managed Care - PPO

## 2022-11-23 DIAGNOSIS — W228XXA Striking against or struck by other objects, initial encounter: Secondary | ICD-10-CM | POA: Insufficient documentation

## 2022-11-23 DIAGNOSIS — S93115A Dislocation of interphalangeal joint of left lesser toe(s), initial encounter: Secondary | ICD-10-CM | POA: Insufficient documentation

## 2022-11-23 DIAGNOSIS — S99921A Unspecified injury of right foot, initial encounter: Secondary | ICD-10-CM | POA: Diagnosis present

## 2022-11-23 DIAGNOSIS — S93104A Unspecified dislocation of right toe(s), initial encounter: Secondary | ICD-10-CM

## 2022-11-23 NOTE — ED Triage Notes (Signed)
Pt stated that she hit her foot on the couch and broke her third toe on her right foot. Very obvious deformity and no ROM. Pt ambulatory to triage

## 2022-11-23 NOTE — ED Provider Notes (Signed)
[place] EMERGENCY DEPARTMENT AT [facility] Provider Note   CSN: [id number] Arrival date & time: 11/23/22  1944     History  Chief Complaint  Patient presents with   Toe Injury    Laurie Chen is a 37 y.o. female.  HPI   This is a 37 year old female presenting after she accidentally hit her right middle toe on a bed frame, she had acute onset of deformity and pain, this occurred a short time ago.  Home Medications Prior to Admission medications   Medication Sig Start Date End Date Taking? Authorizing Provider  fluconazole (DIFLUCAN) 150 MG tablet One tablet once , if needed, for vaginal itch following antibiotic use 10/04/15   Kerri Perches, MD  Levonorgestrel (SKYLA) 13.5 MG IUD by Intrauterine route.    [provider]  penicillin v potassium (VEETID) 500 MG tablet Take 1 tablet (500 mg total) by mouth 3 (three) times daily. 10/04/15   Kerri Perches, MD  phentermine (ADIPEX-P) 37.5 MG tablet Take 1 tablet (37.5 mg total) by mouth daily before breakfast. 10/04/15   Kerri Perches, MD  polycarbophil (FIBERCON) 625 MG tablet Take 625 mg by mouth daily.    [provider]  predniSONE (STERAPRED UNI-PAK 21 TAB) 5 MG (21) TBPK tablet Take 1 tablet (5 mg total) by mouth as directed. Use as directed 10/04/15   Kerri Perches, MD  Prenatal Vit-Fe Fumarate-FA (MULTIVITAMIN-PRENATAL) 27-0.8 MG TABS tablet Take 1 tablet by mouth daily at 12 noon.    [provider]  traMADol (ULTRAM) 50 MG tablet Take 1 tablet (50 mg total) by mouth 3 (three) times daily as needed. 09/12/21   Cristie Hem, PA-C      Allergies    Other    Review of Systems   Review of Systems  Musculoskeletal:  Positive for joint swelling.  Skin:  Negative for wound.    Physical Exam Updated Vital Signs BP (!) 124/108   Pulse 72   Temp 98.7 F (37.1 C) (Oral)   Resp 18   Ht 1.727 m (5\' 8" )   Wt 127 kg   SpO2 100%   BMI 42.57 kg/m  Physical  Exam Vitals and nursing note reviewed.  Constitutional:      Appearance: She is well-developed. She is not diaphoretic.  HENT:     Head: Normocephalic and atraumatic.  Eyes:     General:        Right eye: No discharge.        Left eye: No discharge.     Conjunctiva/sclera: Conjunctivae normal.  Pulmonary:     Effort: Pulmonary effort is normal. No respiratory distress.  Musculoskeletal:     Comments: Right toe is deviated laterally, normal pulses at the foot otherwise  Skin:    General: Skin is warm and dry.     Findings: No erythema or rash.  Neurological:     Mental Status: She is alert.     Coordination: Coordination normal.     ED Results / Procedures / Treatments   Labs (all labs ordered are listed, but only abnormal results are displayed) Labs Reviewed - No data to display  EKG None  Radiology DG Toe 3rd Right  Result Date: 11/23/2022 CLINICAL DATA:  Third toe injury, pain. EXAM: RIGHT THIRD TOE COMPARISON:  None Available. FINDINGS: The third toe is dislocated laterally at the proximal interphalangeal joint with dorsal subluxation. There is a fracture of the middle phalanx involving the medial aspect,  tiny chip fragment remains aligned with the proximal phalanx. There is soft tissue edema of the digit. IMPRESSION: Lateral dislocation of the third toe at the proximal interphalangeal joint with dorsal subluxation. Associated fracture of the middle phalanx. Electronically Signed   By: Narda Rutherford M.D.   On: 11/23/2022 20:54    Procedures Reduction of dislocation  Date/Time: 11/23/2022 8:58 PM  Performed by: Eber Hong, MD Authorized by: Eber Hong, MD  Consent: Verbal consent obtained. Risks and benefits: risks, benefits and alternatives were discussed Consent given by: patient Patient understanding: patient states understanding of the procedure being performed Test results: test results available and properly labeled Site marked: the operative site was  marked Imaging studies: imaging studies available Required items: required blood products, implants, devices, and special equipment available Patient identity confirmed: verbally with patient Time out: Immediately prior to procedure a "time out" was called to verify the correct patient, procedure, equipment, support staff and site/side marked as required. Preparation: Patient was prepped and draped in the usual sterile fashion. Local anesthesia used: yes Anesthesia: digital block  Anesthesia: Local anesthesia used: yes Local Anesthetic: lidocaine 1% with epinephrine Anesthetic total: 2 mL  Sedation: Patient sedated: no  Patient tolerance: patient tolerated the procedure well with no immediate complications Comments: Toe reduced appropriately, immobilized with buddy tape        Medications Ordered in ED Medications - No data to display  ED Course/ Medical Decision Making/ A&P                                 Medical Decision Making Amount and/or Complexity of Data Reviewed Radiology: ordered.   I personally viewed and interpreted the x-ray of the foot which shows a dislocation of the third toe with the proximal phalangeal joint, there is also some dorsal subluxation.  There is a slight fracture of that phalanx as well.  After a digital block was performed the toe was reduced and immobilized with buddy tape and a postop shoe, she will be sent to orthopedics, she is agreeable to the plan.        Final Clinical Impression(s) / ED Diagnoses Final diagnoses:  Toe dislocation, right, initial encounter    Rx / DC Orders ED Discharge Orders     None         Eber Hong, MD 11/23/22 2059

## 2022-11-23 NOTE — ED Notes (Signed)
Laurie Chen taped 2nd and 3rd right toe post reduction by Dr Hyacinth Meeker. Pt tolerated well.

## 2022-11-23 NOTE — ED Notes (Signed)
Pt given post op shoe

## 2022-11-23 NOTE — Discharge Instructions (Signed)
Call to make an appointment to follow-up with the orthopedic surgeon, keep your toe buddy taped to the second toe, use the postop shoe that we have given you, Tylenol or ibuprofen for pain
